# Patient Record
Sex: Male | Born: 1975 | Race: White | Hispanic: Yes | Marital: Married | Smoking: Never smoker
Health system: Southern US, Community
[De-identification: ages and names within clinical notes are randomized; demographics above are authoritative.]

---

## 2018-06-17 ENCOUNTER — Encounter: Payer: Self-pay | Admitting: Family Medicine

## 2018-06-17 ENCOUNTER — Other Ambulatory Visit: Payer: Self-pay

## 2018-06-17 ENCOUNTER — Ambulatory Visit: Payer: Self-pay | Admitting: Family Medicine

## 2018-06-17 ENCOUNTER — Ambulatory Visit: Payer: Self-pay | Admitting: *Deleted

## 2018-06-17 ENCOUNTER — Ambulatory Visit: Payer: 59 | Admitting: Family Medicine

## 2018-06-17 NOTE — Telephone Encounter (Signed)
Patient is new to [place] and has a new patient appointment scheduled with Dr. Terence Lux on 07/18/18 Wife is calling for husband who is at work he has had dizziness without vertigo and a headache for several days. Denies fever/CP/SOB/ NV/cough/congestion. Feels like he is hearing thru a tunnel but no ear pain. He is new to [place] and has a new patient appointment with Dr. Terence Lux on 07/18/18. Speaks Spanish with  some [place]. Acute scheduled for this afternoon. Denies taking any routine medications.  Reason for Disposition . [1] MODERATE dizziness (e.g., interferes with normal activities) AND [2] has NOT been evaluated by physician for this  (Exception: dizziness caused by heat exposure, sudden standing, or poor fluid intake)  Answer Assessment - Initial Assessment Questions 1. DESCRIPTION: "Describe your dizziness."     When moving about. 2. LIGHTHEADED: "Do you feel lightheaded?" (e.g., somewhat faint, woozy, weak upon standing)     Woozy at time. 3. VERTIGO: "Do you feel like either you or the room is spinning or tilting?" (i.e. vertigo)     4. SEVERITY: "How bad is it?"  "Do you feel like you are going to faint?" "Can you stand and walk?"   - MILD - walking normally   - MODERATE - interferes with normal activities (e.g., work, school)    - SEVERE - unable to stand, requires support to walk, feels like passing out now.      Mild he is at work 5. ONSET:  "When did the dizziness begin?"    Several days ago 6. AGGRAVATING FACTORS: "Does anything make it worse?" (e.g., standing, change in head position)     Moving around 7. HEART RATE: "Can you tell me your heart rate?" "How many beats in 15 seconds?"  (Note: not all patients can do this)       Not speaking with patient directly 8. CAUSE: "What do you think is causing the dizziness?"     unsure 9. RECURRENT SYMPTOM: "Have you had dizziness before?" If so, ask: "When was the last time?" "What happened that time?"     no 10. OTHER SYMPTOMS:  "Do you have any other symptoms?" (e.g., fever, chest pain, vomiting, diarrhea, bleeding)       Denies all listed but a headache 11. PREGNANCY: "Is there any chance you are pregnant?" "When was your last menstrual period?"       na  Protocols used: DIZZINESS [facility]

## 2018-06-17 NOTE — Patient Instructions (Signed)
° ° ° °  If you have lab work done today you will be contacted with your lab results within the next 2 weeks.  If you have not heard from us then please contact us. The fastest way to get your results is to register for My Chart. ° ° °IF you received an x-ray today, you will receive an invoice from [facility]. Please contact [facility] at [phone] with questions or concerns regarding your invoice.  ° °IF you received labwork today, you will receive an invoice from [facility]. Please contact [facility] at [phone] with questions or concerns regarding your invoice.  ° °Our billing staff will not be able to assist you with questions regarding bills from these companies. ° °You will be contacted with the lab results as soon as they are available. The fastest way to get your results is to activate your My Chart account. Instructions are located on the last page of this paperwork. If you have not heard from us regarding the results in 2 weeks, please contact this office. °  ° ° ° °

## 2018-06-19 ENCOUNTER — Encounter: Payer: Self-pay | Admitting: Emergency Medicine

## 2018-06-19 ENCOUNTER — Other Ambulatory Visit: Payer: Self-pay

## 2018-06-19 ENCOUNTER — Ambulatory Visit (INDEPENDENT_AMBULATORY_CARE_PROVIDER_SITE_OTHER): Payer: 59 | Admitting: Emergency Medicine

## 2018-06-19 VITALS — BP 124/71 | HR 65 | Temp 99.2°F | Resp 16 | Ht 65.5 in | Wt 179.0 lb

## 2018-06-19 DIAGNOSIS — R42 Dizziness and giddiness: Secondary | ICD-10-CM | POA: Insufficient documentation

## 2018-06-19 DIAGNOSIS — I10 Essential (primary) hypertension: Secondary | ICD-10-CM

## 2018-06-19 MED ORDER — LOSARTAN POTASSIUM 25 MG PO TABS: 25.0000 mg | ORAL_TABLET | Freq: Every day | ORAL | 3 refills | Status: DC

## 2018-06-19 NOTE — Patient Instructions (Addendum)
If you have lab work done today you will be contacted with your lab results within the next 2 weeks.  If you have not heard from [place] then please contact us. The fastest way to get your results is to register for My Chart.   IF you received an x-ray today, you will receive an invoice from [facility]. Please contact [facility] at [phone] with questions or concerns regarding your invoice.   IF you received labwork today, you will receive an invoice from [facility]. Please contact [facility] at [phone] with questions or concerns regarding your invoice.   Our billing staff will not be able to assist you with questions regarding bills from these companies.  You will be contacted with the lab results as soon as they are available. The fastest way to get your results is to activate your My Chart account. Instructions are located on the last page of this paperwork. If you have not heard from [place] regarding the results in 2 weeks, please contact this office.     Mantenimiento de Scientist, forensic hombres (Health Maintenance, Male) Un estilo de vida saludable y los cuidados preventivos son importantes para la salud y Musician. Pregntele al mdico cul es el cronograma de exmenes peridicos adecuado para usted. [place]? Consuma una dieta saludable  Coma muchas verduras, frutas, cereales integrales, productos lcteos con bajo contenido de grasa y Advertising account planner.  No consuma muchos alimentos de alto contenido de grasas slidas, azcares agregados o sal. Mantenga un peso saludable La actividad fsica habitual puede ayudarlo a Science writer o mantener un peso saludable. Deber hacer lo siguiente:  Realizar al menos 126minutos de actividad fsica por semana. El ejercicio debe aumentar la frecuencia cardaca y Actor la transpiracin (ejercicio de [place]).  Hacer ejercicios de entrenamiento de fuerza por lo [employer] por  semana. Controlarse los niveles de colesterol y lpidos en la sangre  Hgase anlisis de sangre para controlar los lpidos y el colesterol cada 5aos a partir de los 35aos. Si tiene un riesgo alto de Best boy cardiopatas coronarias, debe comenzar a [employer] de [place] a los [place]. Es posible que Automotive engineer los niveles de colesterol con mayor frecuencia si: ? Sus niveles de lpidos y colesterol son altos. ? Es mayor de 51OAC. ? Tiene un riesgo alto de tener cardiopatas coronarias. QU DEBO SABER SOBRE LAS PRUEBAS DE [place]? Muchos tipos de cncer se pueden detectar de manera temprana y a menudo prevenirse. Cncer de pulmn  Debe someterse a pruebas de deteccin de cncer de pulmn todos los aos en los siguientes casos: ? Si fuma actualmente y lo ha hecho durante por lo menos 30aos. ? Si fue fumador que dej el hbito en el trmino de los ltimos 15aos.  Hable con el mdico sobre las opciones en relacin con los estudios de deteccin, cundo debe comenzar a Insurance underwriter y con Armed forces operational officer. Cncer colorrectal  Generalmente, las pruebas de deteccin habituales del cncer colorrectal comienzan a los 50aos y deben repetirse cada 5 a 10aos hasta los 75aos. Es posible que tenga que hacerse las pruebas con mayor frecuencia si se detectan formas tempranas de plipos precancerosos o pequeos bultos. Sin embargo, el mdico podr aconsejarle que lo haga antes, si tiene factores de riesgo para el cncer de colon.  El mdico puede recomendarle que use kits de prueba caseros para hallar sangre oculta en la materia fecal.  Se puede usar  una pequea cmara en el extremo de un tubo para examinar el colon (sigmoidoscopia o colonoscopia). Este estudio [employer] formas ms tempranas de Surveyor, minerals. Cncer de prstata y de testculo  En funcin de la edad y del [place] de salud general, el mdico puede realizarle determinados estudios de deteccin del cncer  de prstata y de testculo.  Hable con el mdico sobre cualquier sntoma o acerca de las inquietudes que tenga sobre el cncer de prstata o de testculo. Cncer de piel  Revise la piel de la cabeza a los pies con regularidad.  Informe al mdico si aparecen nuevos lunares o si nota cambios en los que ya tiene, especialmente en estos casos: ? Si hay un cambio en el tamao, la forma o el color del lunar. ? Si tiene un lunar que es ms grande que el tamao de una goma de Games developer.  Siempre use pantalla solar. Aplquese pantalla solar de Lanell Matar y repetida a lo largo del Training and development officer.  Use mangas y [employer], un sombrero de ala ancha y gafas para el sol cuando est al Alexis Goodell, para protegerse. QU DEBO SABER SOBRE LAS CARDIOPATAS CORONARIAS, LA DIABETES Y LA HIPERTENSIN ARTERIAL?  Si usted tiene entre 18 y 30aos, debe medirse la presin arterial cada 3a 5aos. Si usted tiene 40aos o ms, debe medirse la presin arterial [employer]. Debe medirse la presin arterial dos veces: una vez cuando est en un hospital o una clnica y la otra vez cuando est en otro sitio. Registre el promedio de [employer]. Para controlar su presin arterial cuando no est en un hospital o [place], puede usar lo siguiente: ? Jorje Guild automtica para medir la presin arterial en una farmacia. ? Un monitor para medir la presin arterial en el hogar.  Hable con el mdico [employer] ideales de la presin arterial.  Si tiene entre 64 y 79aos, consltele al mdico si debe tomar aspirina para evitar las cardiopatas coronarias.  Hgase anlisis habituales de deteccin de la diabetes; para ello, contrlese la glucemia en ayunas. ? Si su peso es normal y tiene un bajo riesgo de padecer diabetes, realcese este anlisis cada tres aos despus de los 45aos. ? Si tiene sobrepeso y un alto riesgo de padecer diabetes, considere someterse a este anlisis antes o con mayor  frecuencia.  Para los hombres que tienen entre 98 y 43aos, y son o han sido fumadores, se recomienda un nico estudio con ecografa para Hydrographic surveyor un aneurisma de aorta abdominal (AAA). QU DEBO SABER SOBRE LA PREVENCIN DE LAS INFECCIONES? HepatitisB Si tiene un riesgo ms alto de Museum/gallery curator hepatitis B, debe someterse a un examen de deteccin de este virus. Hable con el mdico para determinar si corre riesgo de tener una infeccin por hepatitisB. Hepatitis C Se recomienda un anlisis de [place] para:  Todos los que nacieron entre 1945 y [phone].  Todas las personas que tengan un riesgo de haber contrado hepatitis C. Enfermedades de transmisin sexual (ETS)  Debe realizarse pruebas de Programme researcher, broadcasting/film/video de las ETS todos los aos, incluidas la gonorrea y la clamidia, en estos casos: ? Es sexualmente activo y es menor de [place]. ? Es mayor de 24aos, y Investment banker, operational informa que corre riesgo de tener este tipo de infecciones. ? La actividad sexual ha cambiado desde que le hicieron la ltima prueba de deteccin y tiene un riesgo mayor de Best boy clamidia o Radio broadcast assistant. Pregntele al mdico si usted tiene riesgo.  Consulte a su  mdico para saber si tiene un alto riesgo de infectarse por el VIH. El mdico puede recomendarle un medicamento de venta con receta para ayudar a evitar la infeccin por el VIH. QU MS PUEDO HACER?  Realcese los estudios de rutina de la salud, dentales y de Wellsite geologist.  Mantngase al da con las vacunas (inmunizaciones).  No consuma ningn producto que contenga tabaco, lo que incluye cigarrillos, tabaco de Theatre manager y Administrator, Civil Service. Si necesita ayuda para dejar de fumar, consulte al mdico.  Limite el consumo de alcohol a no ms de por da. [employer] a 12 onzas de cerveza, 5onzas de vino o 1onzas de bebidas alcohlicas de alta graduacin.  No consuma drogas.  No comparta agujas.  Solicite ayuda a su mdico si necesita apoyo o informacin para  abandonar las drogas.  Informe a su mdico si a menudo se siente deprimido.  Notifique a su mdico si alguna vez ha sido vctima de abuso o si no se siente seguro en su hogar. Esta informacin no tiene Theme park manager el consejo del mdico. Asegrese de hacerle al mdico cualquier pregunta que tenga. Document Released: 11/25/2007 Document Revised: 06/19/2014 Document Reviewed: 03/02/2015 Elsevier Interactive Patient Education  2019 [employer].

## 2018-06-19 NOTE — Progress Notes (Signed)
Kyle Hancock 43 y.o.   Chief Complaint  Patient presents with  . Headache    x 5 days  . Dizziness    HISTORY OF PRESENT ILLNESS: This is a 43 y.o. male history of renal vascular hypertension, has taken losartan in the past but presently not taking it, felt dizzy last Sunday morning, however he was able to go and play soccer for 2 hours in the afternoon without any problems or symptoms.  Monday woke up with a headache but was able to go to work.  Headache improved without medication and no other symptoms.  On Tuesday was able to go to the gym and do cardiovascular workout without any symptoms or problems.  Feels better today asymptomatic  HPI   Prior to Admission medications   Medication Sig Start Date End Date Taking? Authorizing Provider  Cyanocobalamin (VITAMIN B 12 PO) Take by mouth daily.   Yes [provider]    No Known Allergies  There are no active problems to display for this patient.   No past medical history on file.    Social History   Socioeconomic History  . Marital status: Married    Spouse name: Not on file  . Number of children: Not on file  . Years of education: Not on file  . Highest education level: Not on file  Occupational History  . Not on file  Social Needs  . Financial resource strain: Not on file  . Food insecurity:    Worry: Not on file    Inability: Not on file  . Transportation needs:    Medical: Not on file    Non-medical: Not on file  Tobacco Use  . Smoking status: Never Smoker  . Smokeless tobacco: Never Used  Substance and Sexual Activity  . Alcohol use: Not on file  . Drug use: Not on file  . Sexual activity: Not on file  Lifestyle  . Physical activity:    Days per week: Not on file    Minutes per session: Not on file  . Stress: Not on file  Relationships  . Social connections:    Talks on phone: Not on file    Gets together: Not on file    Attends religious service: Not on file    Active member  of club or organization: Not on file    Attends meetings of clubs or organizations: Not on file    Relationship status: Not on file  . Intimate partner violence:    Fear of current or ex partner: Not on file    Emotionally abused: Not on file    Physically abused: Not on file    Forced sexual activity: Not on file  Other Topics Concern  . Not on file  Social History Narrative  . Not on file    No family history on file.   Review of Systems  Constitutional: Negative.  Negative for chills and fever.  HENT: Negative.   Eyes: Negative.  Negative for blurred vision and double vision.  Respiratory: Negative.  Negative for cough and shortness of breath.   Cardiovascular: Negative.  Negative for chest pain and palpitations.  Gastrointestinal: Negative.  Negative for abdominal pain, diarrhea, nausea and vomiting.  Genitourinary: Negative.  Negative for dysuria and hematuria.  Skin: Negative.  Negative for rash.  Neurological: Positive for dizziness and headaches.  All other systems reviewed and are negative.   Vitals:   06/19/18 1627  BP: 124/71  Pulse: 65  Resp:  16  Temp: 99.2 F (37.3 C)  SpO2: 97%    Physical Exam Vitals signs reviewed.  Constitutional:      Appearance: He is well-developed.  HENT:     Head: Normocephalic and atraumatic.  Eyes:     Extraocular Movements: Extraocular movements intact.     Pupils: Pupils are equal, round, and reactive to light.  Neck:     Musculoskeletal: Normal range of motion and neck supple.  Cardiovascular:     Rate and Rhythm: Normal rate and regular rhythm.  Pulmonary:     Effort: Pulmonary effort is normal.     Breath sounds: Normal breath sounds.  Abdominal:     General: There is no distension.     Palpations: Abdomen is soft.  Musculoskeletal: Normal range of motion.  Skin:    General: Skin is warm and dry.  Neurological:     General: No focal deficit present.     Mental Status: He is alert and oriented to person,  place, and time.     Cranial Nerves: No cranial nerve deficit.     Sensory: No sensory deficit.     Motor: No weakness.     Coordination: Coordination normal.     Gait: Gait normal.  Psychiatric:        Mood and Affect: Mood normal.        Behavior: Behavior normal.    A total of 30 minutes was spent in the room with the patient, greater than 50% of which was in counseling/coordination of care regarding differential pulses, treatment, medications, and need for blood work and follow-up.   ASSESSMENT & PLAN: Kyle Hancock was seen today for headache and dizziness.  Diagnoses and all orders for this visit:  Dizziness -     CBC with Differential/Platelet -     Comprehensive metabolic panel -     Hemoglobin A1c -     TSH -     Lipid panel  Essential hypertension -     losartan (COZAAR) 25 MG tablet; Take 1 tablet (25 mg total) by mouth daily.    Patient Instructions       If you have lab work done today you will be contacted with your lab results within the next 2 weeks.  If you have not heard from us then please contact us. The fastest way to get your results is to register for My Chart.   IF you received an x-ray today, you will receive an invoice [facility]. Please contact [facility] at [phone] with questions or concerns regarding your invoice.   IF you received labwork today, you will receive an invoice from [facility]. Please contact [facility] at [phone] with questions or concerns regarding your invoice.   Our billing staff will not be able to assist you with questions regarding bills from these companies.  You will be contacted with the lab results as soon as they are available. The fastest way to get your results is to activate your My Chart account. Instructions are located on the last page of this paperwork. If you have not heard from us regarding the results in 2 weeks, please contact this office.     Mantenimiento de Engineer, civil (consulting)la salud en los  hombres (Health Maintenance, Male) Un estilo de vida saludable y los cuidados preventivos son importantes para la salud y Counsellorel bienestar. Pregntele al mdico cul es el cronograma de exmenes peridicos adecuado para usted. QU DEBO SABER SOBRE EL PESO Y LA DIETA? Consuma una dieta  saludable  Coma muchas verduras, frutas, cereales integrales, productos lcteos con bajo contenido de grasa y Associate Professorprotenas magras.  No consuma muchos alimentos de alto contenido de grasas slidas, azcares agregados o sal. Mantenga un peso saludable La actividad fsica habitual puede ayudarlo a Baristaalcanzar o mantener un peso saludable. Deber hacer lo siguiente:  Realizar al menos 150minutos de actividad fsica por semana. El ejercicio debe aumentar la frecuencia cardaca y Development worker, international aidprovocar la transpiracin (ejercicio de Grass Valleyintensidad moderada).  Hacer ejercicios de entrenamiento de fuerza por lo Rite Aidmenos dos veces por semana. Controlarse los niveles de colesterol y lpidos en la sangre  Hgase anlisis de sangre para controlar los lpidos y el colesterol cada 5aos a partir de los 35aos. Si tiene un riesgo alto de Warehouse managertener cardiopatas coronarias, debe comenzar a Assuranthacerse anlisis de [place] Colonysangre a los [place]20aos. Es posible que Insurance underwriternecesite controlar los niveles de colesterol con mayor frecuencia si: ? Sus niveles de lpidos y colesterol son altos. ? Es mayor de [id number]. ? Tiene un riesgo alto de tener cardiopatas coronarias. QU DEBO SABER SOBRE LAS PRUEBAS DE DETECCIN DEL CNCER? Muchos tipos de cncer se pueden detectar de manera temprana y a menudo prevenirse. Cncer de pulmn  Debe someterse a pruebas de deteccin de cncer de pulmn todos los aos en los siguientes casos: ? Si fuma actualmente y lo ha hecho durante por lo menos 30aos. ? Si fue fumador que dej el hbito en el trmino de los ltimos 15aos.  Hable con el mdico sobre las opciones en relacin con los estudios de deteccin, cundo debe comenzar a Actuaryhacrselos y con  Engineer, structuralqu frecuencia. Cncer colorrectal  Generalmente, las pruebas de deteccin habituales del cncer colorrectal comienzan a los 50aos y deben repetirse cada 5 a 10aos hasta los 75aos. Es posible que tenga que hacerse las pruebas con mayor frecuencia si se detectan formas tempranas de plipos precancerosos o pequeos bultos. Sin embargo, el mdico podr aconsejarle que lo haga antes, si tiene factores de riesgo para el cncer de colon.  El mdico puede recomendarle que use kits de prueba caseros para Recruitment consultanthallar sangre oculta en la materia fecal.  Se puede usar una pequea cmara en el extremo de un tubo para examinar el colon (sigmoidoscopia o colonoscopia). Este estudio PPG Industriesdetecta las formas ms tempranas de Building services engineercncer colorrectal. Cncer de prstata y de testculo  En funcin de la edad y del Crystal Beachestado de salud general, el mdico puede realizarle determinados estudios de deteccin del cncer de prstata y de testculo.  Hable con el mdico sobre cualquier sntoma o acerca de las inquietudes que tenga sobre el cncer de prstata o de testculo. Cncer de piel  Revise la piel de la cabeza a los pies con regularidad.  Informe al mdico si aparecen nuevos lunares o si nota cambios en los que ya tiene, especialmente en estos casos: ? Si hay un cambio en el tamao, la forma o el color del lunar. ? Si tiene un lunar que es ms grande que el tamao de una goma de Paramediclpiz.  Siempre use pantalla solar. Aplquese pantalla solar de Barth Kirksmanera generosa y repetida a lo largo del Futures traderda.  Use mangas y Automatic Datapantalones largos, un sombrero de ala ancha y gafas para el sol cuando est al Guadalupe Dawnaire libre, para protegerse. QU DEBO SABER SOBRE LAS CARDIOPATAS CORONARIAS, LA DIABETES Y LA HIPERTENSIN ARTERIAL?  Si usted tiene entre 18 y 39aos, debe medirse la presin arterial cada 3a 5aos. Si usted tiene 40aos o ms, debe medirse la presin arterial Allied Waste Industriestodos los aos.  Debe medirse la presin arterial dos veces: una vez cuando est en  un hospital o una clnica y la otra vez cuando est en otro sitio. Registre el promedio de [employer]. Para controlar su presin arterial cuando no est en un hospital o Paulita Cradle, puede usar lo siguiente: ? Valere Dross automtica para medir la presin arterial en una farmacia. ? Un monitor para medir la presin arterial en el hogar.  Hable con el mdico [employer] ideales de la presin arterial.  Si tiene entre 45 y 79aos, consltele al mdico si debe tomar aspirina para evitar las cardiopatas coronarias.  Hgase anlisis habituales de deteccin de la diabetes; para ello, contrlese la glucemia en ayunas. ? Si su peso es normal y tiene un bajo riesgo de padecer diabetes, realcese este anlisis cada tres aos despus de los 45aos. ? Si tiene sobrepeso y un alto riesgo de padecer diabetes, considere someterse a este anlisis antes o con mayor frecuencia.  Para los hombres que tienen entre 65 y 73aos, y son o han sido fumadores, se recomienda un nico estudio con ecografa para Engineer, manufacturing un aneurisma de aorta abdominal (AAA). QU DEBO SABER SOBRE LA PREVENCIN DE LAS INFECCIONES? HepatitisB Si tiene un riesgo ms alto de Primary school teacher hepatitis B, debe someterse a un examen de deteccin de este virus. Hable con el mdico para determinar si corre riesgo de tener una infeccin por hepatitisB. Hepatitis C Se recomienda un anlisis de [place] para:  Todos los que nacieron entre 1945 y [phone].  Todas las personas que tengan un riesgo de haber contrado hepatitis C. Enfermedades de transmisin sexual (ETS)  Debe realizarse pruebas de Airline pilot de las ETS todos los aos, incluidas la gonorrea y la clamidia, en estos casos: ? Es sexualmente activo y es menor de [place]. ? Es mayor de 24aos, y Public affairs consultant informa que corre riesgo de tener este tipo de infecciones. ? La actividad sexual ha cambiado desde que le hicieron la ltima prueba de deteccin y tiene un riesgo mayor de  Warehouse manager clamidia o Copy. Pregntele al mdico si usted tiene riesgo.  Consulte a su mdico para saber si tiene un alto riesgo de infectarse por el VIH. El mdico puede recomendarle un medicamento de venta con receta para ayudar a evitar la infeccin por el VIH. QU MS PUEDO HACER?  Realcese los estudios de rutina de la salud, dentales y de Wellsite geologist.  Mantngase al da con las vacunas (inmunizaciones).  No consuma ningn producto que contenga tabaco, lo que incluye cigarrillos, tabaco de Theatre manager y Administrator, Civil Service. Si necesita ayuda para dejar de fumar, consulte al mdico.  Limite el consumo de alcohol a no ms de por da. [employer] a 12 onzas de cerveza, 5onzas de vino o 1onzas de bebidas alcohlicas de alta graduacin.  No consuma drogas.  No comparta agujas.  Solicite ayuda a su mdico si necesita apoyo o informacin para abandonar las drogas.  Informe a su mdico si a menudo se siente deprimido.  Notifique a su mdico si alguna vez ha sido vctima de abuso o si no se siente seguro en su hogar. Esta informacin no tiene Theme park manager el consejo del mdico. Asegrese de hacerle al mdico cualquier pregunta que tenga. Document Released: 11/25/2007 Document Revised: 06/19/2014 Document Reviewed: 03/02/2015 Elsevier Interactive Patient Education  2019 Elsevier Inc.      Edwina Barth, MD [facility]

## 2018-06-20 LAB — CBC WITH DIFFERENTIAL/PLATELET
Basophils Absolute: 0.1 10*3/uL (ref 0.0–0.2)
Basos: 1 %
EOS (ABSOLUTE): 0.1 10*3/uL (ref 0.0–0.4)
Eos: 1 %
Hematocrit: 44.8 % (ref 37.5–51.0)
Hemoglobin: 14.7 g/dL (ref 13.0–17.7)
Immature Grans (Abs): 0 10*3/uL (ref 0.0–0.1)
Immature Granulocytes: 0 %
LYMPHS: 33 %
Lymphocytes Absolute: 2 10*3/uL (ref 0.7–3.1)
MCH: 26.1 pg — ABNORMAL LOW (ref 26.6–33.0)
MCHC: 32.8 g/dL (ref 31.5–35.7)
MCV: 80 fL (ref 79–97)
Monocytes Absolute: 0.4 10*3/uL (ref 0.1–0.9)
Monocytes: 7 %
Neutrophils Absolute: 3.5 10*3/uL (ref 1.4–7.0)
Neutrophils: 58 %
Platelets: 271 10*3/uL (ref 150–450)
RBC: 5.63 x10E6/uL (ref 4.14–5.80)
RDW: 13 % (ref 11.6–15.4)
WBC: 6.1 10*3/uL (ref 3.4–10.8)

## 2018-06-20 LAB — LIPID PANEL
Chol/HDL Ratio: 3.1 ratio (ref 0.0–5.0)
Cholesterol, Total: 217 mg/dL — ABNORMAL HIGH (ref 100–199)
HDL: 69 mg/dL (ref >39)
LDL Calculated: 113 mg/dL — ABNORMAL HIGH (ref 0–99)
Triglycerides: 173 mg/dL — ABNORMAL HIGH (ref 0–149)
VLDL Cholesterol Cal: 35 mg/dL (ref 5–40)

## 2018-06-20 LAB — COMPREHENSIVE METABOLIC PANEL
A/G RATIO: 2 (ref 1.2–2.2)
ALT: 43 IU/L (ref 0–44)
AST: 35 IU/L (ref 0–40)
Albumin: 4.8 g/dL (ref 3.5–5.5)
Alkaline Phosphatase: 81 IU/L (ref 39–117)
BILIRUBIN TOTAL: 0.3 mg/dL (ref 0.0–1.2)
BUN/Creatinine Ratio: 16 (ref 9–20)
BUN: 18 mg/dL (ref 6–24)
CALCIUM: 10.4 mg/dL — AB (ref 8.7–10.2)
CO2: 23 mmol/L (ref 20–29)
Chloride: 103 mmol/L (ref 96–106)
Creatinine, Ser: 1.14 mg/dL (ref 0.76–1.27)
GFR calc Af Amer: 92 mL/min/{1.73_m2} (ref >59)
GFR calc non Af Amer: 79 mL/min/{1.73_m2} (ref >59)
Globulin, Total: 2.4 g/dL (ref 1.5–4.5)
Glucose: 107 mg/dL — ABNORMAL HIGH (ref 65–99)
POTASSIUM: 4.3 mmol/L (ref 3.5–5.2)
Sodium: 142 mmol/L (ref 134–144)
Total Protein: 7.2 g/dL (ref 6.0–8.5)

## 2018-06-20 LAB — HEMOGLOBIN A1C
Est. average glucose Bld gHb Est-mCnc: 114 mg/dL
Hgb A1c MFr Bld: 5.6 % (ref 4.8–5.6)

## 2018-06-20 LAB — TSH: TSH: 1.31 u[IU]/mL (ref 0.450–4.500)

## 2018-06-21 ENCOUNTER — Encounter: Payer: Self-pay | Admitting: Radiology

## 2018-06-24 ENCOUNTER — Telehealth: Payer: Self-pay | Admitting: Emergency Medicine

## 2018-06-24 ENCOUNTER — Encounter: Payer: Self-pay | Admitting: Emergency Medicine

## 2018-06-24 NOTE — Telephone Encounter (Signed)
Result note read to patient; verbalizes understanding. Pt requests results be routed to MyChart. Also states he would like Dr. Alvy Bimler to know he is taking medication as instructed and taking his BP daily. He will bring record to next OV.   Results from 06/19/2018, unable to document in result notes.

## 2018-07-18 ENCOUNTER — Encounter: Payer: Self-pay | Admitting: Emergency Medicine

## 2018-07-18 ENCOUNTER — Telehealth: Payer: Self-pay | Admitting: Emergency Medicine

## 2018-07-18 ENCOUNTER — Other Ambulatory Visit: Payer: Self-pay

## 2018-07-18 ENCOUNTER — Ambulatory Visit (INDEPENDENT_AMBULATORY_CARE_PROVIDER_SITE_OTHER): Payer: 59 | Admitting: Emergency Medicine

## 2018-07-18 VITALS — BP 118/67 | HR 70 | Temp 98.9°F | Resp 16 | Ht 65.5 in | Wt 175.6 lb

## 2018-07-18 DIAGNOSIS — E785 Hyperlipidemia, unspecified: Secondary | ICD-10-CM

## 2018-07-18 DIAGNOSIS — R42 Dizziness and giddiness: Secondary | ICD-10-CM | POA: Diagnosis not present

## 2018-07-18 DIAGNOSIS — I10 Essential (primary) hypertension: Secondary | ICD-10-CM

## 2018-07-18 NOTE — Telephone Encounter (Signed)
[facility] called and spoke to [place], [facility] about the refill request. She says there are refills available. The 90 day supply sent on 06/19/18, the patient picked up a 30 day supply, so he still has the 60 left and he may have to pay out of pocket, if the insurance doesn't approve the 60. I called the patient and advised to call the pharmacy for the refill and advised of the above, he verbalized understanding.

## 2018-07-18 NOTE — Progress Notes (Signed)
Kyle Hancock 43 y.o.   Chief Complaint  Patient presents with  . Dizziness    per patient sometimes he still have and this morning, follow up weight and  blood pressure    HISTORY OF PRESENT ILLNESS: This is a 43 y.o. male here for follow-up of 06/19/2018 visit when I saw him for intermittent dizziness.  Work-up was negative.  Working out for a Marathon coming up this weekend.  Occasionally has some dizziness but is self-limited and does not last long.  No associated symptoms.  Has been taking losartan for blood pressure.  Blood pressure chart at home within normal limits.  No other complaints or medical concerns.  Recent blood work showed normal CBC and normal CMP.  Nonfasting sample showed increased cholesterol and triglycerides.  HPI   Prior to Admission medications   Medication Sig Start Date End Date Taking? Authorizing Provider  losartan (COZAAR) 25 MG tablet Take 1 tablet (25 mg total) by mouth daily. 06/19/18 09/17/18 Yes Miho Monda, Eilleen KempfMiguel Jose, MD  Cyanocobalamin (VITAMIN B 12 PO) Take by mouth daily.    [provider]    No Known Allergies  Patient Active Problem List   Diagnosis Date Noted  . Dizziness 06/19/2018  . Essential hypertension 06/19/2018    No past medical history on file.  No past surgical history on file.  Social History   Socioeconomic History  . Marital status: Married    Spouse name: Not on file  . Number of children: Not on file  . Years of education: Not on file  . Highest education level: Not on file  Occupational History  . Not on file  Social Needs  . Financial resource strain: Not on file  . Food insecurity:    Worry: Not on file    Inability: Not on file  . Transportation needs:    Medical: Not on file    Non-medical: Not on file  Tobacco Use  . Smoking status: Never Smoker  . Smokeless tobacco: Never Used  Substance and Sexual Activity  . Alcohol use: Not on file  . Drug use: Not on file  . Sexual activity:  Not on file  Lifestyle  . Physical activity:    Days per week: Not on file    Minutes per session: Not on file  . Stress: Not on file  Relationships  . Social connections:    Talks on phone: Not on file    Gets together: Not on file    Attends religious service: Not on file    Active member of club or organization: Not on file    Attends meetings of clubs or organizations: Not on file    Relationship status: Not on file  . Intimate partner violence:    Fear of current or ex partner: Not on file    Emotionally abused: Not on file    Physically abused: Not on file    Forced sexual activity: Not on file  Other Topics Concern  . Not on file  Social History Narrative  . Not on file    No family history on file.   Review of Systems  Constitutional: Negative.   HENT: Negative.   Eyes: Negative.   Cardiovascular: Negative.   Gastrointestinal: Negative.   Genitourinary: Negative.   Skin: Negative.   Neurological: Positive for dizziness. Negative for headaches.  Endo/Heme/Allergies: Negative.   All other systems reviewed and are negative.  Vitals:   07/18/18 0812  BP: 118/67  Pulse: 70  Resp: 16  Temp: 98.9 F (37.2 C)  SpO2: 96%     Physical Exam Vitals signs reviewed.  Constitutional:      Appearance: Normal appearance.  HENT:     Head: Normocephalic and atraumatic.     Nose: Nose normal.     Mouth/Throat:     Mouth: Mucous membranes are moist.     Pharynx: Oropharynx is clear.  Eyes:     Extraocular Movements: Extraocular movements intact.     Conjunctiva/sclera: Conjunctivae normal.     Pupils: Pupils are equal, round, and reactive to light.  Cardiovascular:     Pulses: Normal pulses.     Heart sounds: Normal heart sounds.  Pulmonary:     Effort: Pulmonary effort is normal.     Breath sounds: Normal breath sounds.  Abdominal:     Palpations: Abdomen is soft.     Tenderness: There is no abdominal tenderness.  Musculoskeletal: Normal range of motion.    Skin:    General: Skin is warm and dry.     Capillary Refill: Capillary refill takes less than 2 seconds.  Neurological:     General: No focal deficit present.     Mental Status: He is alert and oriented to person, place, and time.  Psychiatric:        Mood and Affect: Mood normal.        Behavior: Behavior normal.      ASSESSMENT & PLAN: Kyle Hancock was seen today for dizziness.  Diagnoses and all orders for this visit:  Dyslipidemia -     Lipid panel  Dizziness  Essential hypertension     Patient Instructions       If you have lab work done today you will be contacted with your lab results within the next 2 weeks.  If you have not heard from [place] then please contact us. The fastest way to get your results is to register for My Chart.   IF you received an x-ray today, you will receive an invoice from [facility]. Please contact [facility] at [phone] with questions or concerns regarding your invoice.   IF you received labwork today, you will receive an invoice from [place]. Please contact [facility] at [phone] with questions or concerns regarding your invoice.   Our billing staff will not be able to assist you with questions regarding bills from these companies.  You will be contacted with the lab results as soon as they are available. The fastest way to get your results is to activate your My Chart account. Instructions are located on the last page of this paperwork. If you have not heard from [place] regarding the results in 2 weeks, please contact this office.     Health Maintenance, Male A healthy lifestyle and preventive care is important for your health and wellness. Ask your health care provider about what schedule of regular examinations is right for you. What should I know about weight and diet? Eat a Healthy Diet  Eat plenty of vegetables, fruits, whole grains, low-fat dairy products, and lean protein.  Do not eat a lot of foods high  in solid fats, added sugars, or salt.  Maintain a Healthy Weight Regular exercise can help you achieve or maintain a healthy weight. You should:  Do at least 150 minutes of exercise each week. The exercise should increase your heart rate and make you sweat (moderate-intensity exercise).  Do strength-training exercises at least twice a week. Watch Your Levels of Cholesterol and Blood Lipids  Have your blood tested for lipids and cholesterol every 5 years starting at 42 years of age. If you are at high risk for heart disease, you should start having your blood tested when you are 42 years old. You may need to have your cholesterol levels checked more often if: ? Your lipid or cholesterol levels are high. ? You are older than 42 years of age. ? You are at high risk for heart disease. What should I know about cancer screening? Many types of cancers can be detected early and may often be prevented. Lung Cancer  You should be screened every year for lung cancer if: ? You are a current smoker who has smoked for at least 30 years. ? You are a former smoker who has quit within the past 15 years.  Talk to your health care provider about your screening options, when you should start screening, and how often you should be screened. Colorectal Cancer  Routine colorectal cancer screening usually begins at 42 years of age and should be repeated every 5-10 years until you are 42 years old. You may need to be screened more often if early forms of precancerous polyps or small growths are found. Your health care provider may recommend screening at an earlier age if you have risk factors for colon cancer.  Your health care provider may recommend using home test kits to check for hidden blood in the stool.  A small camera at the end of a tube can be used to examine your colon (sigmoidoscopy or colonoscopy). This checks for the earliest forms of colorectal cancer. Prostate and Testicular Cancer  Depending  on your age and overall health, your health care provider may do certain tests to screen for prostate and testicular cancer.  Talk to your health care provider about any symptoms or concerns you have about testicular or prostate cancer. Skin Cancer  Check your skin from head to toe regularly.  Tell your health care provider about any new moles or changes in moles, especially if: ? There is a change in a mole's size, shape, or color. ? You have a mole that is larger than a pencil eraser.  Always use sunscreen. Apply sunscreen liberally and repeat throughout the day.  Protect yourself by wearing long sleeves, pants, a wide-brimmed hat, and sunglasses when outside. What should I know about heart disease, diabetes, and high blood pressure?  If you are 1618-42 years of age, have your blood pressure checked every 3-5 years. If you are 42 years of age or older, have your blood pressure checked every year. You should have your blood pressure measured twice-once when you are at a hospital or clinic, and once when you are not at a hospital or clinic. Record the average of the two measurements. To check your blood pressure when you are not at a hospital or clinic, you can use: ? An automated blood pressure machine at a pharmacy. ? A home blood pressure monitor.  Talk to your health care provider about your target blood pressure.  If you are between 5645-42 years old, ask your health care provider if you should take aspirin to prevent heart disease.  Have regular diabetes screenings by checking your fasting blood sugar level. ? If you are at a normal weight and have a low risk for diabetes, have this test once every three years after the age of 42. ? If you are overweight and have a high risk for diabetes, consider being tested at a younger  age or more often.  A one-time screening for abdominal aortic aneurysm (AAA) by ultrasound is recommended for men aged 65-75 years who are current or former  smokers. What should I know about preventing infection? Hepatitis B If you have a higher risk for hepatitis B, you should be screened for this virus. Talk with your health care provider to find out if you are at risk for hepatitis B infection. Hepatitis C Blood testing is recommended for:  Everyone born from [birth year] through 1964.  Anyone with known risk factors for hepatitis C. Sexually Transmitted Diseases (STDs)  You should be screened each year for STDs including gonorrhea and chlamydia if: ? You are sexually active and are younger than 42 years of age. ? You are older than 41 years of age and your health care provider tells you that you are at risk for this type of infection. ? Your sexual activity has changed since you were last screened and you are at an increased risk for chlamydia or gonorrhea. Ask your health care provider if you are at risk.  Talk with your health care provider about whether you are at high risk of being infected with HIV. Your health care provider may recommend a prescription medicine to help prevent HIV infection. What else can I do?  Schedule regular health, dental, and eye exams.  Stay current with your vaccines (immunizations).  Do not use any tobacco products, such as cigarettes, chewing tobacco, and e-cigarettes. If you need help quitting, ask your health care provider.  Limit alcohol intake to no more than 2 drinks per day. One drink equals 12 ounces of beer, 5 ounces of wine, or 1 ounces of hard liquor.  Do not use street drugs.  Do not share needles.  Ask your health care provider for help if you need support or information about quitting drugs.  Tell your health care provider if you often feel depressed.  Tell your health care provider if you have ever been abused or do not feel safe at home. This information is not intended to replace advice given to you by your health care provider. Make sure you discuss any questions you have with your  health care provider. Document Released: 11/25/2007 Document Revised: 01/26/2016 Document Reviewed: 03/02/2015 Elsevier Interactive Patient Education  2019 Elsevier Inc.     Edwina Barth, MD [facility]

## 2018-07-18 NOTE — Telephone Encounter (Signed)
Copied from CRM [id number]. Topic: Quick Communication - Rx Refill/Question >> Jul 18, 2018  3:12 PM Tamela Oddi wrote: Medication: losartan (COZAAR) 25 MG tablet  Patient called to request a refill for the above medication.  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

## 2018-07-18 NOTE — Patient Instructions (Addendum)

## 2018-07-19 ENCOUNTER — Encounter: Payer: Self-pay | Admitting: Emergency Medicine

## 2018-07-19 LAB — LIPID PANEL
Chol/HDL Ratio: 2.7 ratio (ref 0.0–5.0)
Cholesterol, Total: 188 mg/dL (ref 100–199)
HDL: 69 mg/dL (ref >39)
LDL CALC: 106 mg/dL — AB (ref 0–99)
Triglycerides: 65 mg/dL (ref 0–149)
VLDL Cholesterol Cal: 13 mg/dL (ref 5–40)

## 2019-02-20 ENCOUNTER — Other Ambulatory Visit: Payer: Self-pay

## 2019-02-20 DIAGNOSIS — Z20822 Contact with and (suspected) exposure to covid-19: Secondary | ICD-10-CM

## 2019-02-21 LAB — NOVEL CORONAVIRUS, NAA: SARS-CoV-2, NAA: DETECTED — AB

## 2019-02-24 ENCOUNTER — Telehealth: Payer: Self-pay | Admitting: Internal Medicine

## 2019-02-24 NOTE — Telephone Encounter (Signed)
Called patient, no answer. Patient is aware of result from [place] and from [facility] This is an MD follow up. Will try again later.  Angelica Chessman, MD

## 2019-05-01 ENCOUNTER — Ambulatory Visit: Payer: Self-pay | Admitting: Emergency Medicine

## 2019-05-02 ENCOUNTER — Encounter: Payer: Self-pay | Admitting: Emergency Medicine

## 2019-08-26 ENCOUNTER — Other Ambulatory Visit: Payer: Self-pay

## 2019-08-26 ENCOUNTER — Ambulatory Visit: Payer: BLUE CROSS/BLUE SHIELD | Admitting: Emergency Medicine

## 2019-08-26 ENCOUNTER — Encounter: Payer: Self-pay | Admitting: Emergency Medicine

## 2019-08-26 VITALS — BP 127/76 | HR 74 | Temp 98.3°F | Resp 16 | Ht 65.75 in | Wt 184.0 lb

## 2019-08-26 DIAGNOSIS — E785 Hyperlipidemia, unspecified: Secondary | ICD-10-CM | POA: Diagnosis not present

## 2019-08-26 DIAGNOSIS — I10 Essential (primary) hypertension: Secondary | ICD-10-CM | POA: Diagnosis not present

## 2019-08-26 DIAGNOSIS — Z8616 Personal history of COVID-19: Secondary | ICD-10-CM

## 2019-08-26 MED ORDER — LOSARTAN POTASSIUM 50 MG PO TABS: 50.0000 mg | ORAL_TABLET | Freq: Every day | ORAL | 3 refills | Status: DC

## 2019-08-26 NOTE — Patient Instructions (Addendum)
   If you have lab work done today you will be contacted with your lab results within the next 2 weeks.  If you have not heard from us then please contact us. The fastest way to get your results is to register for My Chart.   IF you received an x-ray today, you will receive an invoice from [facility]. Please contact [facility] at [phone] with questions or concerns regarding your invoice.   IF you received labwork today, you will receive an invoice from [facility]. Please contact [facility] at [phone] with questions or concerns regarding your invoice.   Our billing staff will not be able to assist you with questions regarding bills from these companies.  You will be contacted with the lab results as soon as they are available. The fastest way to get your results is to activate your My Chart account. Instructions are located on the last page of this paperwork. If you have not heard from us regarding the results in 2 weeks, please contact this office.      Hipertensin en los adultos Hypertension, Adult El trmino hipertensin es otra forma de denominar a la presin arterial elevada. La presin arterial elevada fuerza al corazn a trabajar ms para bombear la sangre. Esto puede causar problemas con el paso del tiempo. Una lectura de presin arterial est compuesta por 2 nmeros. Hay un nmero superior (sistlico) sobre un nmero inferior (diastlico). Lo ideal es tener la presin arterial por debajo de 120/80. Las elecciones saludables pueden ayudar a bajar la presin arterial, o tal vez necesite medicamentos para bajarla. Cules son las causas? Se desconoce la causa de esta afeccin. Algunas afecciones pueden estar relacionadas con la presin arterial alta. Qu incrementa el riesgo?  Fumar.  Tener diabetes mellitus tipo 2, colesterol alto, o ambos.  No hacer la cantidad suficiente de actividad fsica o ejercicio.  Tener sobrepeso.  Consumir mucha grasa,  azcar, caloras o sal (sodio) en su dieta.  Beber alcohol en exceso.  Tener una enfermedad renal a largo plazo (crnica).  Tener antecedentes familiares de presin arterial alta.  Edad. Los riesgos aumentan con la edad.  Raza. El riesgo es mayor para las personas afroamericanas.  Sexo. Antes de los 45aos, los hombres corren ms riesgo que las mujeres. Despus de los 65aos, las mujeres corren ms riesgo que los hombres.  Tener apnea obstructiva del sueo.  Estrs. Cules son los signos o los sntomas?  Es posible que la presin arterial alta puede no cause sntomas. La presin arterial muy alta (crisis hipertensiva) puede provocar: ? Dolor de cabeza. ? Sensaciones de preocupacin o nerviosismo (ansiedad). ? Falta de aire. ? Hemorragia nasal. ? Sensacin de malestar en el estmago (nuseas). ? Vmitos. ? Cambios en la forma de ver. ? Dolor muy intenso en el pecho. ? Convulsiones. Cmo se trata?  Esta afeccin se trata haciendo cambios saludables en el estilo de vida, por ejemplo: ? Consumir alimentos saludables. ? Hacer ms ejercicio. ? Beber menos alcohol.  El mdico puede recetarle medicamentos si los cambios en el estilo de vida no son suficientes para lograr controlar la presin arterial y si: ? El nmero de arriba est por encima de 130. ? El nmero de abajo est por encima de 80.  Su presin arterial personal ideal puede variar. Siga estas instrucciones en su casa: Comida y bebida   Si se lo dicen, siga el plan de alimentacin de DASH (Dietary Approaches to Stop Hypertension, Maneras de alimentarse para detener la hipertensin).   Para seguir este plan: ? Llene la mitad del plato de cada comida con frutas y verduras. ? Llene un cuarto del plato de cada comida con cereales integrales. Los cereales integrales incluyen pasta integral, arroz integral y pan integral. ? Coma y beba productos lcteos con bajo contenido de grasa, como leche descremada o yogur bajo en  grasas. ? Llene un cuarto del plato de cada comida con protenas bajas en grasa (magras). Las protenas bajas en grasa incluyen pescado, pollo sin piel, huevos, frijoles y tofu. ? Evite consumir carne grasa, carne curada y procesada, o pollo con piel. ? Evite consumir alimentos prehechos o procesados.  Consuma menos de 1500 mg de sal por da.  No beba alcohol si: ? El mdico le indica que no lo haga. ? Est embarazada, puede estar embarazada o est tratando de quedar embarazada.  Si bebe alcohol: ? Limite la cantidad que bebe a lo siguiente:  De 0 a 1 medida por da para las mujeres.  De 0 a 2 medidas por da para los hombres. ? Est atento a la cantidad de alcohol que hay en las bebidas que toma. En [place], una medida equivale a una botella de cerveza de 12oz (355ml), un vaso de vino de 5oz (148ml) o un vaso de una bebida alcohlica de alta graduacin de 1oz (44ml). Estilo de vida   Trabaje con su mdico para mantenerse en un peso saludable o para perder peso. Pregntele a su mdico cul es el peso recomendable para usted.  Haga al menos 30minutos de ejercicio la mayora de los das de la semana. Estos pueden incluir caminar, nadar o andar en bicicleta.  Realice al menos 30 minutos de ejercicio que fortalezca sus msculos (ejercicios de resistencia) al menos 3 das a la semana. Estos pueden incluir levantar pesas o hacer Pilates.  No consuma ningn producto que contenga nicotina o tabaco, como cigarrillos, cigarrillos electrnicos y tabaco de mascar. Si necesita ayuda para dejar de fumar, consulte al mdico.  Controle su presin arterial en su casa tal como le indic el mdico.  Concurra a todas las visitas de seguimiento como se lo haya indicado el mdico. Esto es importante. Medicamentos  Tome los medicamentos de venta libre y los recetados solamente como se lo haya indicado el mdico. Siga cuidadosamente las indicaciones.  No omita las dosis de medicamentos  para la presin arterial. Los medicamentos pierden eficacia si omite dosis. El hecho de omitir las dosis tambin aumenta el riesgo de otros problemas.  Pregntele a su mdico a qu efectos secundarios o reacciones a los medicamentos debe prestar atencin. Comunquese con un mdico si:  Piensa que tiene una reaccin a los medicamentos que est tomando.  Tiene dolores de cabeza frecuentes (recurrentes).  Se siente mareado.  Tiene hinchazn en los tobillos.  Tiene problemas de visin. Solicite ayuda inmediatamente si:  Siente un dolor de cabeza muy intenso.  Empieza a sentirse desorientado (confundido).  Se siente dbil o adormecido.  Siente que va a desmayarse.  Tiene un dolor muy intenso en las siguientes zonas: ? Pecho. ? Vientre (abdomen).  Vomita ms de una vez.  Tiene dificultad para respirar. Resumen  El trmino hipertensin es otra forma de denominar a la presin arterial elevada.  La presin arterial elevada fuerza al corazn a trabajar ms para bombear la sangre.  Para la mayora de las personas, una presin arterial normal es menor que 120/80.  Las decisiones saludables pueden ayudarle a disminuir su presin arterial. Si no puede   bajar su presin arterial mediante decisiones saludables, es posible que deba tomar medicamentos. Esta informacin no tiene como fin reemplazar el consejo del mdico. Asegrese de hacerle al mdico cualquier pregunta que tenga. Document Revised: 03/14/2018 Document Reviewed: 03/14/2018 Elsevier Patient Education  2020 Elsevier Inc.  

## 2019-08-26 NOTE — Progress Notes (Signed)
Kyle Hancock 44 y.o.   Chief Complaint  Patient presents with  . Hypertension    with labs    HISTORY OF PRESENT ILLNESS: This is a 44 y.o. male with history of hypertension here for follow-up.  Presently not taking losartan 25 mg.  Ran out of medication.  Needs refill.  Requesting higher dose if needed. BP Readings from Last 3 Encounters:  08/26/19 127/76  07/18/18 118/67  06/19/18 124/71  Also has a history of dyslipidemia on no medication.  Last lipid profile not fasting. Lab Results  Component Value Date   CHOL 188 07/18/2018   HDL 69 07/18/2018   LDLCALC 106 (H) 07/18/2018   TRIG 65 07/18/2018   CHOLHDL 2.7 07/18/2018  Had Covid infection last September with minimal symptoms.  Asymptomatic.  Him and his wife did well. No other complaints or medical concerns today. Past medical history includes left-sided kidney issue several years ago when he developed hydronephrosis and needed surgical intervention.  Unsure of diagnosis.  HPI   Prior to Admission medications   Medication Sig Start Date End Date Taking? Authorizing Provider  Cyanocobalamin (VITAMIN B 12 PO) Take by mouth daily.    [provider]  losartan (COZAAR) 25 MG tablet Take 1 tablet (25 mg total) by mouth daily. 06/19/18 09/17/18  Kyle Quint, MD    No Known Allergies  Patient Active Problem List   Diagnosis Date Noted  . Dyslipidemia 08/26/2019  . Dizziness 06/19/2018  . Essential hypertension 06/19/2018    History reviewed. No pertinent past medical history.  History reviewed. No pertinent surgical history.  Social History   Socioeconomic History  . Marital status: Married    Spouse name: Not on file  . Number of children: Not on file  . Years of education: Not on file  . Highest education level: Not on file  Occupational History  . Not on file  Tobacco Use  . Smoking status: Never Smoker  . Smokeless tobacco: Never Used  Substance and Sexual Activity  .  Alcohol use: Not on file  . Drug use: Not on file  . Sexual activity: Not on file  Other Topics Concern  . Not on file  Social History Narrative  . Not on file   Social Determinants of Health   Financial Resource Strain:   . Difficulty of Paying Living Expenses:   Food Insecurity:   . Worried About Programme researcher, broadcasting/film/video in the Last Year:   . Barista in the Last Year:   Transportation Needs:   . Freight forwarder (Medical):   Marland Kitchen Lack of Transportation (Non-Medical):   Physical Activity:   . Days of Exercise per Week:   . Minutes of Exercise per Session:   Stress:   . Feeling of Stress :   Social Connections:   . Frequency of Communication with Friends and Family:   . Frequency of Social Gatherings with Friends and Family:   . Attends Religious Services:   . Active Member of Clubs or Organizations:   . Attends Banker Meetings:   Marland Kitchen Marital Status:   Intimate Partner Violence:   . Fear of Current or Ex-Partner:   . Emotionally Abused:   Marland Kitchen Physically Abused:   . Sexually Abused:     History reviewed. No pertinent family history.   Review of Systems  Constitutional: Negative.  Negative for chills and fever.  HENT: Negative.  Negative for congestion and sore throat.   Respiratory:  Negative.  Negative for cough.   Cardiovascular: Negative.  Negative for chest pain and palpitations.  Gastrointestinal: Negative.  Negative for abdominal pain, diarrhea, nausea and vomiting.  Genitourinary: Negative.  Negative for dysuria and hematuria.  Musculoskeletal: Negative.  Negative for back pain, myalgias and neck pain.  Skin: Negative.  Negative for rash.  Neurological: Negative.  Negative for dizziness and headaches.  All other systems reviewed and are negative.  Today's Vitals   08/26/19 0822  BP: 127/76  Pulse: 74  Resp: 16  Temp: 98.3 F (36.8 C)  TempSrc: Temporal  SpO2: 97%  Weight: 184 lb (83.5 kg)  Height: 5' 5.75" (1.67 m)   Body mass index  is 29.93 kg/m.   Physical Exam Vitals reviewed.  Constitutional:      Appearance: Normal appearance.  HENT:     Head: Normocephalic.     Mouth/Throat:     Mouth: Mucous membranes are moist.     Pharynx: Oropharynx is clear.  Eyes:     Extraocular Movements: Extraocular movements intact.     Conjunctiva/sclera: Conjunctivae normal.     Pupils: Pupils are equal, round, and reactive to light.  Cardiovascular:     Rate and Rhythm: Normal rate and regular rhythm.     Pulses: Normal pulses.     Heart sounds: Normal heart sounds.  Pulmonary:     Effort: Pulmonary effort is normal.     Breath sounds: Normal breath sounds.  Abdominal:     General: Bowel sounds are normal. There is no distension.     Palpations: Abdomen is soft. There is no mass.     Tenderness: There is no abdominal tenderness.  Musculoskeletal:        General: Normal range of motion.     Cervical back: Normal range of motion and neck supple.  Skin:    General: Skin is warm and dry.     Capillary Refill: Capillary refill takes less than 2 seconds.  Neurological:     General: No focal deficit present.     Mental Status: He is alert and oriented to person, place, and time.  Psychiatric:        Mood and Affect: Mood normal.        Behavior: Behavior normal.      ASSESSMENT & PLAN: Clinically stable.  No medical concerns identified during this visit.  Continue present medications. Follow-up in 6 to 12 months.  Kyle Hancock was seen today for hypertension.  Diagnoses and all orders for this visit:  Essential hypertension -     CBC with Differential/Platelet -     Comprehensive metabolic panel -     Uric Acid -     losartan (COZAAR) 50 MG tablet; Take 1 tablet (50 mg total) by mouth daily.  Dyslipidemia -     Lipid panel  History of COVID-19 -     SAR CoV2 Serology (COVID 19)AB(IGG)IA    Patient Instructions       If you have lab work done today you will be contacted with your lab results within  the next 2 weeks.  If you have not heard from [place] then please contact us. The fastest way to get your results is to register for My Chart.   IF you received an x-ray today, you will receive an invoice from [facility] Radiology. Please contact [facility] Radiology at [phone] with questions or concerns regarding your invoice.   IF you received labwork today, you will receive an invoice from [place]. Please  contact [facility] at [phone] with questions or concerns regarding your invoice.   Our billing staff will not be able to assist you with questions regarding bills from these companies.  You will be contacted with the lab results as soon as they are available. The fastest way to get your results is to activate your My Chart account. Instructions are located on the last page of this paperwork. If you have not heard from [place] regarding the results in 2 weeks, please contact this office.     Hipertensin en los adultos Hypertension, Adult El trmino hipertensin es otra forma de denominar a la presin arterial elevada. La presin arterial elevada fuerza al corazn a trabajar ms para bombear la sangre. Esto puede causar problemas con el paso del [place]. Una lectura de presin arterial est compuesta por 2 nmeros. Hay un nmero superior (sistlico) sobre un nmero inferior (diastlico). Lo ideal es tener la presin arterial por debajo de 120/80. Las elecciones saludables pueden ayudar a Engineer, materials presin arterial, o tal vez necesite medicamentos para bajarla. Cules son las causas? Se desconoce la causa de esta afeccin. Algunas afecciones pueden estar relacionadas con la presin arterial alta. Qu incrementa el riesgo?  Fumar.  Tener diabetes mellitus tipo 2, colesterol alto, o ambos.  No hacer la cantidad suficiente de actividad fsica o ejercicio.  Tener sobrepeso.  Consumir mucha grasa, azcar, caloras o sal (sodio) en su dieta.  Beber alcohol en exceso.  Tener una enfermedad  renal a largo plazo (crnica).  Tener antecedentes familiares de presin arterial alta.  Edad. Los riesgos aumentan con la edad.  Raza. El riesgo es mayor para las Retail banker.  Sexo. Antes de los 45aos, los hombres corren ms [employer]. Despus de los 65aos, las mujeres corren ms [employer].  Tener apnea obstructiva del sueo.  Estrs. Cules son los signos o los sntomas?  Es posible que la presin arterial alta puede no cause sntomas. La presin arterial muy alta (crisis hipertensiva) puede provocar: ? Dolor de [place]. ? Sensaciones de preocupacin o nerviosismo (ansiedad). ? Falta de aire. ? Hemorragia nasal. ? Sensacin de Engineer, site (nuseas). ? Vmitos. ? Cambios en la forma de ver. ? Dolor muy intenso en el pecho. ? Convulsiones. Cmo se trata?  Esta afeccin se trata haciendo cambios saludables en el estilo de vida, por ejemplo: ? Consumir alimentos saludables. ? Hacer ms ejercicio. ? Beber menos alcohol.  El mdico puede recetarle medicamentos si los cambios en el estilo de vida no son suficientes para Child psychotherapist la presin arterial y si: ? El nmero de arriba est por encima de 130. ? El nmero de abajo est por encima de 80.  Su presin arterial personal ideal puede variar. Siga estas instrucciones en su casa: Comida y bebida   Si se lo dicen, siga el plan de alimentacin de DASH (Dietary Approaches to Stop Hypertension, Maneras de alimentarse para detener la hipertensin). Para seguir este plan: ? Llene la mitad del plato de cada comida con frutas y verduras. ? Llene un cuarto del plato de cada comida con cereales integrales. Los cereales integrales incluyen pasta integral, arroz integral y pan integral. ? Coma y beba productos lcteos con bajo contenido de grasa, como leche descremada o yogur bajo en grasas. ? Llene un cuarto del plato de cada comida con protenas bajas en grasa (magras). Las  protenas bajas en grasa incluyen pescado, pollo sin piel, huevos, frijoles y tofu. ? Evite consumir carne  grasa, carne curada y procesada, o pollo con piel. ? Evite consumir alimentos prehechos o procesados.  Consuma menos de 1500 mg de sal por da.  No beba alcohol si: ? El mdico le indica que no lo haga. ? Est embarazada, puede estar embarazada o est tratando de quedar embarazada.  Si bebe alcohol: ? Limite la cantidad que bebe a lo siguiente:  De 0 a 1 medida por da para las mujeres.  De 0 a 2 medidas por da para los hombres. ? Est atento a la cantidad de alcohol que hay en las bebidas que toma. En los [place], una medida equivale a una botella de cerveza de 12oz ( ), un vaso de vino de 5oz ( ) o un vaso de una bebida alcohlica de alta graduacin de 1oz (81ml). Estilo de vida   Trabaje con su mdico para mantenerse en un peso saludable o para perder peso. Pregntele a su mdico cul es el peso recomendable para usted.  Haga al menos de ejercicio la [employer] de la [place]. Estos pueden incluir caminar, nadar o andar en bicicleta.  Realice al menos 30 minutos de ejercicio que fortalezca sus msculos (ejercicios de resistencia) al menos 3 das a la [place]. Estos pueden incluir levantar pesas o hacer Pilates.  No consuma ningn producto que contenga nicotina o tabaco, como cigarrillos, cigarrillos electrnicos y tabaco de Theatre manager. Si necesita ayuda para dejar de fumar, consulte al [employer].  Controle su presin arterial en su casa tal como le indic el mdico.  Concurra a todas las visitas de seguimiento como se lo haya indicado el mdico. Esto es importante. Medicamentos  [employer] de venta libre y los recetados solamente como se lo haya indicado el mdico. Siga cuidadosamente las indicaciones.  No omita las dosis de medicamentos para la presin arterial. Los medicamentos pierden eficacia si omite dosis. El hecho de omitir  las dosis tambin [place] el riesgo de otros problemas.  Pregntele a su mdico a qu efectos secundarios o reacciones a los Museum/gallery curator. Comunquese con un mdico si:  Piensa que tiene [place] reaccin a los medicamentos que est tomando.  Tiene dolores de cabeza frecuentes (recurrentes).  Se siente mareado.  Tiene hinchazn en los tobillos.  Tiene problemas de visin. Solicite ayuda inmediatamente si:  Siente un dolor de cabeza muy intenso.  Empieza a sentirse desorientado (confundido).  Se siente dbil o adormecido.  Siente que va a desmayarse.  Tiene un dolor muy intenso en las siguientes zonas: ? Pecho. ? Vientre (abdomen).  Vomita ms de una vez.  Tiene dificultad para respirar. Resumen  El trmino hipertensin es otra forma de denominar a la presin arterial elevada.  La presin arterial elevada fuerza al corazn a trabajar ms para bombear la sangre.  Para la [employer], una presin arterial normal es menor que 120/80.  Las decisiones saludables pueden ayudarle a disminuir su presin arterial. Si no puede bajar su presin arterial mediante decisiones saludables, es posible que deba tomar medicamentos. Esta informacin no tiene Theme park manager el consejo del mdico. Asegrese de hacerle al mdico cualquier pregunta que tenga. Document Revised: 03/14/2018 Document Reviewed: 03/14/2018 Elsevier Patient Education  2020 Elsevier Inc.     Edwina Barth, MD [facility]

## 2019-08-27 ENCOUNTER — Encounter: Payer: Self-pay | Admitting: Emergency Medicine

## 2019-08-27 ENCOUNTER — Telehealth: Payer: Self-pay | Admitting: Emergency Medicine

## 2019-08-27 LAB — COMPREHENSIVE METABOLIC PANEL
ALT: 48 IU/L — ABNORMAL HIGH (ref 0–44)
AST: 32 IU/L (ref 0–40)
Albumin/Globulin Ratio: 1.8 (ref 1.2–2.2)
Albumin: 4.6 g/dL (ref 4.0–5.0)
Alkaline Phosphatase: 78 IU/L (ref 39–117)
BUN/Creatinine Ratio: 15 (ref 9–20)
BUN: 15 mg/dL (ref 6–24)
Bilirubin Total: 0.5 mg/dL (ref 0.0–1.2)
CO2: 25 mmol/L (ref 20–29)
Calcium: 10.4 mg/dL — ABNORMAL HIGH (ref 8.7–10.2)
Chloride: 103 mmol/L (ref 96–106)
Creatinine, Ser: 1.02 mg/dL (ref 0.76–1.27)
GFR calc Af Amer: 104 mL/min/{1.73_m2} (ref >59)
GFR calc non Af Amer: 90 mL/min/{1.73_m2} (ref >59)
Globulin, Total: 2.5 g/dL (ref 1.5–4.5)
Glucose: 87 mg/dL (ref 65–99)
Potassium: 4.6 mmol/L (ref 3.5–5.2)
Sodium: 139 mmol/L (ref 134–144)
Total Protein: 7.1 g/dL (ref 6.0–8.5)

## 2019-08-27 LAB — CBC WITH DIFFERENTIAL/PLATELET
Basophils Absolute: 0.1 10*3/uL (ref 0.0–0.2)
Basos: 1 %
EOS (ABSOLUTE): 0 10*3/uL (ref 0.0–0.4)
Eos: 1 %
Hematocrit: 43.4 % (ref 37.5–51.0)
Hemoglobin: 14.5 g/dL (ref 13.0–17.7)
Immature Grans (Abs): 0 10*3/uL (ref 0.0–0.1)
Immature Granulocytes: 0 %
Lymphocytes Absolute: 2 10*3/uL (ref 0.7–3.1)
Lymphs: 41 %
MCH: 25.8 pg — ABNORMAL LOW (ref 26.6–33.0)
MCHC: 33.4 g/dL (ref 31.5–35.7)
MCV: 77 fL — ABNORMAL LOW (ref 79–97)
Monocytes Absolute: 0.4 10*3/uL (ref 0.1–0.9)
Monocytes: 7 %
Neutrophils Absolute: 2.5 10*3/uL (ref 1.4–7.0)
Neutrophils: 50 %
Platelets: 230 10*3/uL (ref 150–450)
RBC: 5.61 x10E6/uL (ref 4.14–5.80)
RDW: 13 % (ref 11.6–15.4)
WBC: 5 10*3/uL (ref 3.4–10.8)

## 2019-08-27 LAB — SAR COV2 SEROLOGY (COVID19)AB(IGG),IA: DiaSorin SARS-CoV-2 Ab, IgG: NEGATIVE

## 2019-08-27 LAB — URIC ACID: Uric Acid: 5.4 mg/dL (ref 3.8–8.4)

## 2019-08-27 LAB — LIPID PANEL
Chol/HDL Ratio: 2.9 ratio (ref 0.0–5.0)
Cholesterol, Total: 202 mg/dL — ABNORMAL HIGH (ref 100–199)
HDL: 69 mg/dL (ref >39)
LDL Chol Calc (NIH): 120 mg/dL — ABNORMAL HIGH (ref 0–99)
Triglycerides: 72 mg/dL (ref 0–149)
VLDL Cholesterol Cal: 13 mg/dL (ref 5–40)

## 2019-08-27 NOTE — Telephone Encounter (Signed)
Blood results discussed with patient. 

## 2019-08-29 NOTE — Telephone Encounter (Signed)
Please advise message is in spanish

## 2019-08-30 NOTE — Telephone Encounter (Signed)
Spoke to patient. Thanks

## 2020-05-24 ENCOUNTER — Ambulatory Visit (INDEPENDENT_AMBULATORY_CARE_PROVIDER_SITE_OTHER): Payer: BLUE CROSS/BLUE SHIELD | Admitting: Emergency Medicine

## 2020-05-24 ENCOUNTER — Encounter: Payer: Self-pay | Admitting: Emergency Medicine

## 2020-05-24 ENCOUNTER — Other Ambulatory Visit: Payer: Self-pay

## 2020-05-24 VITALS — BP 129/72 | HR 77 | Temp 98.6°F | Resp 16 | Ht 66.0 in | Wt 181.0 lb

## 2020-05-24 DIAGNOSIS — K6289 Other specified diseases of anus and rectum: Secondary | ICD-10-CM | POA: Diagnosis not present

## 2020-05-24 DIAGNOSIS — K644 Residual hemorrhoidal skin tags: Secondary | ICD-10-CM

## 2020-05-24 MED ORDER — HYDROCORTISONE (PERIANAL) 2.5 % EX CREA: 1.0000 "application " | TOPICAL_CREAM | Freq: Two times a day (BID) | CUTANEOUS | 5 refills | Status: AC

## 2020-05-24 NOTE — Progress Notes (Signed)
Kyle Hancock 44 y.o.   Chief Complaint  Patient presents with  . Hemorrhoids    Per patient started last Wednesday, bump on the right side and uncomfortable to sit down    HISTORY OF PRESENT ILLNESS: This is a 44 y.o. male complaining of painful hemorrhoid that started last week and is now better. Denies rectal bleeding or any other associated symptoms. No other complaints or medical concerns today.  HPI   Prior to Admission medications   Medication Sig Start Date End Date Taking? Authorizing Provider  losartan (COZAAR) 50 MG tablet Take 1 tablet (50 mg total) by mouth daily. 08/26/19  Yes Edrik Rundle, Eilleen Kempf, MD  Cyanocobalamin (VITAMIN B 12 PO) Take by mouth daily. Patient not taking: Reported on 05/24/2020    [provider]    No Known Allergies  Patient Active Problem List   Diagnosis Date Noted  . Dyslipidemia 08/26/2019  . Dizziness 06/19/2018  . Essential hypertension 06/19/2018    History reviewed. No pertinent past medical history.  History reviewed. No pertinent surgical history.  Social History   Socioeconomic History  . Marital status: Married    Spouse name: Not on file  . Number of children: Not on file  . Years of education: Not on file  . Highest education level: Not on file  Occupational History  . Not on file  Tobacco Use  . Smoking status: Never Smoker  . Smokeless tobacco: Never Used  Substance and Sexual Activity  . Alcohol use: Not on file  . Drug use: Not on file  . Sexual activity: Not on file  Other Topics Concern  . Not on file  Social History Narrative  . Not on file   Social Determinants of Health   Financial Resource Strain: Not on file  Food Insecurity: Not on file  Transportation Needs: Not on file  Physical Activity: Not on file  Stress: Not on file  Social Connections: Not on file  Intimate Partner Violence: Not on file    History reviewed. No pertinent family history.   Review of  Systems  Constitutional: Negative.  Negative for chills and fever.  HENT: Negative.  Negative for congestion and sore throat.   Respiratory: Negative.  Negative for cough and shortness of breath.   Cardiovascular: Negative.  Negative for chest pain and palpitations.  Gastrointestinal: Negative.  Negative for abdominal pain, diarrhea, nausea and vomiting.  Genitourinary: Negative.  Negative for dysuria and hematuria.  Skin: Negative.  Negative for rash.  Neurological: Negative.  Negative for dizziness and headaches.  All other systems reviewed and are negative.     Today's Vitals   05/24/20 1417  BP: 129/72  Pulse: 77  Resp: 16  Temp: 98.6 F (37 C)  TempSrc: Temporal  SpO2: 98%  Weight: 181 lb (82.1 kg)  Height: 5\' 6"  (1.676 m)   Body mass index is 29.21 kg/m.  Physical Exam Vitals reviewed.  Constitutional:      Appearance: Normal appearance.  HENT:     Head: Normocephalic.  Eyes:     Extraocular Movements: Extraocular movements intact.     Pupils: Pupils are equal, round, and reactive to light.  Cardiovascular:     Rate and Rhythm: Normal rate and regular rhythm.     Heart sounds: Normal heart sounds.  Pulmonary:     Effort: Pulmonary effort is normal.     Breath sounds: Normal breath sounds.  Genitourinary:    Rectum: External hemorrhoid present. No tenderness or anal  fissure.  Musculoskeletal:        General: Normal range of motion.     Cervical back: Normal range of motion.  Skin:    General: Skin is warm and dry.     Capillary Refill: Capillary refill takes less than 2 seconds.  Neurological:     General: No focal deficit present.     Mental Status: He is alert and oriented to person, place, and time.  Psychiatric:        Mood and Affect: Mood normal.        Behavior: Behavior normal.      ASSESSMENT & PLAN: Kyle Hancock was seen today for hemorrhoids.  Diagnoses and all orders for this visit:  External hemorrhoid -     hydrocortisone (ANUSOL-HC)  2.5 % rectal cream; Place 1 application rectally 2 (two) times daily.  Rectal pain    Patient Instructions       If you have lab work done today you will be contacted with your lab results within the next 2 weeks.  If you have not heard from [place] then please contact us. The fastest way to get your results is to register for My Chart.   IF you received an x-ray today, you will receive an invoice from [facility] Radiology. Please contact [facility] Radiology at [phone] with questions or concerns regarding your invoice.   IF you received labwork today, you will receive an invoice from [place]. Please contact [facility] at [phone] with questions or concerns regarding your invoice.   Our billing staff will not be able to assist you with questions regarding bills from these companies.  You will be contacted with the lab results as soon as they are available. The fastest way to get your results is to activate your My Chart account. Instructions are located on the last page of this paperwork. If you have not heard from [place] regarding the results in 2 weeks, please contact this office.     Hemorroides Hemorrhoids Las hemorroides son venas inflamadas que pueden desarrollarse:  En el ano (recto). Estas se denominan hemorroides internas.  Alrededor de la abertura del ano. Estas se denominan hemorroides externas. Las hemorroides pueden causar dolor, picazn o hemorragias. Generalmente no causan problemas graves. Con frecuencia mejoran al [employer] dieta, el estilo de vida y otros tratamientos Facilities manager. Cules son las causas? Esta afeccin puede ser causada por lo siguiente:  Tener dificultad para defecar (estreimiento).  Hacer mucha fuerza (esfuerzo) para defecar.  Materia fecal lquida (diarrea).  Embarazo.  Tener mucho sobrepeso (obesidad).  Estar sentado durante largos perodos de [place].  Levantar objetos pesados u otras actividades que impliquen esfuerzo.  Sexo  anal.  Andar en bicicleta por un largo perodo de tiempo. Cules son los signos o los sntomas? Los sntomas de esta afeccin incluyen los siguientes:  Engineer, mining.  Picazn o irritacin en el ano.  Sangrado proveniente del ano.  Prdida de materia fecal.  Inflamacin en la zona.  Uno o ms bultos alrededor de la abertura del ano. Cmo se diagnostica? A menudo un mdico puede diagnosticar esta afeccin al observar la zona afectada. El mdico tambin puede:  Education officer, environmental un examen que implica palpar la zona con la mano enguantada (examen rectal digital).  Examinar el interior de la zona anal utilizando un pequeo tubo (anoscopio).  Pedir anlisis de [place]. Es posible que esto se realice si ha perdido Warden/ranger.  Solicitarle un estudio que consiste en la observacin del interior del colon utilizando un tubo  flexible con Posey Boyer en el extremo (sigmoidoscopia o colonoscopa). Cmo se trata? Esta afeccin generalmente se puede tratar en el hogar. El mdico puede indicarle que cambie de Paediatric nurse, de estilo de vida o que trate de Psychologist, sport and exercise. Si esto no da resultado, se pueden realizar procedimientos para extirpar las hemorroides o reducir Brewing technologist. Estos pueden implicar lo siguiente:  Museum/gallery conservator en la base de las hemorroides para interrumpir la irrigacin de Risk manager.  Inyectar un medicamento en las hemorroides para reducir Brewing technologist.  Dirigir un tipo de Engineer, drilling de luz hacia las hemorroides para Radio producer que se caigan.  Realizar Neomia Dear ciruga para extirpar las hemorroides o cortar la irrigacin de [place]. Siga estas indicaciones en su casa: Comida y bebida   Consuma alimentos con alto contenido de [place]. Entre ellos cereales integrales, frijoles, frutos secos, frutas y verduras.  Pregntele a su mdico acerca de tomar productos con fibra aadida en ellos (complementos defibra).  Disminuya la cantidad de grasa de la dieta. Para esto, puede  hacer lo siguiente: ? Coma productos lcteos descremados. ? Coma menos carne roja. ? No consuma alimentos procesados.  Beba suficiente lquido para Photographer orina de color amarillo plido. Control del dolor y la hinchazn   Tome un bao de agua tibia (bao de asiento) durante 20 minutos para Engineer, materials. Hgalo 3 o 4veces al da. Puede hacer esto en una baera o usar un dispositivo porttil para bao de asiento que se coloca sobre el inodoro.  Si se lo indican, aplique hielo sobre la zona dolorida. Puede ser beneficioso aplicar hielo [employer] baos con agua tibia. ? Ponga el hielo en una bolsa plstica. ? Coloque una [employer] piel y Copy. ? Coloque el hielo durante , 2 a 3veces por da. Indicaciones generales  [employer] de venta libre y los recetados solamente como se lo haya indicado el mdico. ? Las [employer] Net y los medicamentos pueden usarse como se lo hayan indicado.  Haga ejercicio fsico con frecuencia. Consulte al mdico qu tipos de ejercicios son mejores para usted y qu cantidad.  Vaya al bao cuando sienta ganas de defecar. No espere.  Evite hacer demasiada fuerza al defecar.  Mantenga el ano seco y limpio. Use papel higinico hmedo o toallitas humedecidas despus de defecar.  No pase mucho tiempo sentado en el inodoro.  Concurra a todas las visitas de [address] se lo haya indicado el mdico. Esto es importante. Comunquese con un mdico si:  Tiene dolor e hinchazn que no mejoran con el tratamiento o los medicamentos.  Tiene problemas para defecar.  No puede defecar.  Tiene dolor o hinchazn en la zona exterior de las hemorroides. Solicite ayuda inmediatamente si tiene:  Hemorragia que no se detiene. Resumen  Las hemorroides son venas hinchadas en el ano o la zona que rodea el ano.  Pueden causar dolor, picazn o sangrado.  Consuma alimentos con alto contenido de [place]. Entre ellos cereales  integrales, frijoles, frutos secos, frutas y verduras.  Tome un bao de agua tibia (bao de asiento) durante 20 minutos para Engineer, materials. Hgalo 3 o 4veces al da. Esta informacin no tiene Theme park manager el consejo del mdico. Asegrese de hacerle al mdico cualquier pregunta que tenga. Document Revised: 12/06/2017 Document Reviewed: 12/06/2017 Elsevier Patient Education  2020 Elsevier Inc.      Edwina Barth, MD [facility]

## 2020-05-24 NOTE — Patient Instructions (Addendum)
   If you have lab work done today you will be contacted with your lab results within the next 2 weeks.  If you have not heard from us then please contact us. The fastest way to get your results is to register for My Chart.   IF you received an x-ray today, you will receive an invoice from [facility]. Please contact [facility] at [phone] with questions or concerns regarding your invoice.   IF you received labwork today, you will receive an invoice from [facility]. Please contact [facility] at [phone] with questions or concerns regarding your invoice.   Our billing staff will not be able to assist you with questions regarding bills from these companies.  You will be contacted with the lab results as soon as they are available. The fastest way to get your results is to activate your My Chart account. Instructions are located on the last page of this paperwork. If you have not heard from us regarding the results in 2 weeks, please contact this office.     Hemorroides Hemorrhoids Las hemorroides son venas inflamadas que pueden desarrollarse:  En el ano (recto). Estas se denominan hemorroides internas.  Alrededor de la abertura del ano. Estas se denominan hemorroides externas. Las hemorroides pueden causar dolor, picazn o hemorragias. Generalmente no causan problemas graves. Con frecuencia mejoran al cambiar la dieta, el estilo de vida y otros tratamientos en el hogar. Cules son las causas? Esta afeccin puede ser causada por lo siguiente:  Tener dificultad para defecar (estreimiento).  Hacer mucha fuerza (esfuerzo) para defecar.  Materia fecal lquida (diarrea).  Embarazo.  Tener mucho sobrepeso (obesidad).  Estar sentado durante largos perodos de tiempo.  Levantar objetos pesados u otras actividades que impliquen esfuerzo.  Sexo anal.  Andar en bicicleta por un largo perodo de tiempo. Cules son los signos o los sntomas? Los sntomas de  esta afeccin incluyen los siguientes:  Dolor.  Picazn o irritacin en el ano.  Sangrado proveniente del ano.  Prdida de materia fecal.  Inflamacin en la zona.  Uno o ms bultos alrededor de la abertura del ano. Cmo se diagnostica? A menudo un mdico puede diagnosticar esta afeccin al observar la zona afectada. El mdico tambin puede:  Realizar un examen que implica palpar la zona con la mano enguantada (examen rectal digital).  Examinar el interior de la zona anal utilizando un pequeo tubo (anoscopio).  Pedir anlisis de sangre. Es posible que esto se realice si ha perdido mucha sangre.  Solicitarle un estudio que consiste en la observacin del interior del colon utilizando un tubo flexible con una cmara en el extremo (sigmoidoscopia o colonoscopa). Cmo se trata? Esta afeccin generalmente se puede tratar en el hogar. El mdico puede indicarle que cambie de alimentacin, de estilo de vida o que trate de realizar tratamientos en el hogar. Si esto no da resultado, se pueden realizar procedimientos para extirpar las hemorroides o reducir su tamao. Estos pueden implicar lo siguiente:  Colocar bandas elsticas en la base de las hemorroides para interrumpir la irrigacin de la sangre.  Inyectar un medicamento en las hemorroides para reducir su tamao.  Dirigir un tipo de energa de luz hacia las hemorroides para hacer que se caigan.  Realizar una ciruga para extirpar las hemorroides o cortar la irrigacin de sangre. Siga estas indicaciones en su casa: Comida y bebida   Consuma alimentos con alto contenido de fibra. Entre ellos cereales integrales, frijoles, frutos secos, frutas y verduras.  Pregntele a su mdico   acerca de tomar productos con fibra aadida en ellos (complementos defibra).  Disminuya la cantidad de grasa de la dieta. Para esto, puede hacer lo siguiente: ? Coma productos lcteos descremados. ? Coma menos carne roja. ? No consuma alimentos  procesados.  Beba suficiente lquido para mantener la orina de color amarillo plido. Control del dolor y la hinchazn   Tome un bao de agua tibia (bao de asiento) durante 20 minutos para aliviar el dolor. Hgalo 3 o 4veces al da. Puede hacer esto en una baera o usar un dispositivo porttil para bao de asiento que se coloca sobre el inodoro.  Si se lo indican, aplique hielo sobre la zona dolorida. Puede ser beneficioso aplicar hielo entre los baos con agua tibia. ? Ponga el hielo en una bolsa plstica. ? Coloque una toalla entre la piel y la bolsa. ? Coloque el hielo durante 20minutos, 2 a 3veces por da. Indicaciones generales  Tome los medicamentos de venta libre y los recetados solamente como se lo haya indicado el mdico. ? Las cremas medicinales y los medicamentos pueden usarse como se lo hayan indicado.  Haga ejercicio fsico con frecuencia. Consulte al mdico qu tipos de ejercicios son mejores para usted y qu cantidad.  Vaya al bao cuando sienta ganas de defecar. No espere.  Evite hacer demasiada fuerza al defecar.  Mantenga el ano seco y limpio. Use papel higinico hmedo o toallitas humedecidas despus de defecar.  No pase mucho tiempo sentado en el inodoro.  Concurra a todas las visitas de seguimiento como se lo haya indicado el mdico. Esto es importante. Comunquese con un mdico si:  Tiene dolor e hinchazn que no mejoran con el tratamiento o los medicamentos.  Tiene problemas para defecar.  No puede defecar.  Tiene dolor o hinchazn en la zona exterior de las hemorroides. Solicite ayuda inmediatamente si tiene:  Hemorragia que no se detiene. Resumen  Las hemorroides son venas hinchadas en el ano o la zona que rodea el ano.  Pueden causar dolor, picazn o sangrado.  Consuma alimentos con alto contenido de fibra. Entre ellos cereales integrales, frijoles, frutos secos, frutas y verduras.  Tome un bao de agua tibia (bao de asiento) durante 20  minutos para aliviar el dolor. Hgalo 3 o 4veces al da. Esta informacin no tiene como fin reemplazar el consejo del mdico. Asegrese de hacerle al mdico cualquier pregunta que tenga. Document Revised: 12/06/2017 Document Reviewed: 12/06/2017 Elsevier Patient Education  2020 [employer]  

## 2020-12-21 ENCOUNTER — Ambulatory Visit: Payer: Self-pay | Admitting: Emergency Medicine

## 2021-05-10 ENCOUNTER — Other Ambulatory Visit: Payer: Self-pay | Admitting: Emergency Medicine

## 2021-05-10 DIAGNOSIS — I10 Essential (primary) hypertension: Secondary | ICD-10-CM

## 2021-05-10 MED ORDER — LOSARTAN POTASSIUM 50 MG PO TABS: 50.0000 mg | ORAL_TABLET | Freq: Every day | ORAL | 3 refills | Status: DC

## 2021-07-07 ENCOUNTER — Other Ambulatory Visit: Payer: Self-pay

## 2021-07-07 ENCOUNTER — Ambulatory Visit (INDEPENDENT_AMBULATORY_CARE_PROVIDER_SITE_OTHER): Payer: BC Managed Care – PPO | Admitting: Emergency Medicine

## 2021-07-07 ENCOUNTER — Encounter: Payer: Self-pay | Admitting: Emergency Medicine

## 2021-07-07 VITALS — BP 126/80 | HR 85 | Ht 66.0 in | Wt 185.0 lb

## 2021-07-07 DIAGNOSIS — Z9889 Other specified postprocedural states: Secondary | ICD-10-CM | POA: Diagnosis not present

## 2021-07-07 DIAGNOSIS — I10 Essential (primary) hypertension: Secondary | ICD-10-CM | POA: Diagnosis not present

## 2021-07-07 DIAGNOSIS — Z23 Encounter for immunization: Secondary | ICD-10-CM | POA: Diagnosis not present

## 2021-07-07 DIAGNOSIS — Z130 Encounter for screening for diseases of the blood and blood-forming organs and certain disorders involving the immune mechanism: Secondary | ICD-10-CM | POA: Diagnosis not present

## 2021-07-07 DIAGNOSIS — E785 Hyperlipidemia, unspecified: Secondary | ICD-10-CM

## 2021-07-07 DIAGNOSIS — Z1329 Encounter for screening for other suspected endocrine disorder: Secondary | ICD-10-CM

## 2021-07-07 DIAGNOSIS — Z789 Other specified health status: Secondary | ICD-10-CM

## 2021-07-07 DIAGNOSIS — Z13220 Encounter for screening for lipoid disorders: Secondary | ICD-10-CM

## 2021-07-07 DIAGNOSIS — Z0000 Encounter for general adult medical examination without abnormal findings: Secondary | ICD-10-CM

## 2021-07-07 DIAGNOSIS — Z13228 Encounter for screening for other metabolic disorders: Secondary | ICD-10-CM | POA: Diagnosis not present

## 2021-07-07 DIAGNOSIS — Z113 Encounter for screening for infections with a predominantly sexual mode of transmission: Secondary | ICD-10-CM

## 2021-07-07 LAB — CBC WITH DIFFERENTIAL/PLATELET
Basophils Absolute: 0.1 10*3/uL (ref 0.0–0.1)
Basophils Relative: 1 % (ref 0.0–3.0)
Eosinophils Absolute: 0.1 10*3/uL (ref 0.0–0.7)
Eosinophils Relative: 0.9 % (ref 0.0–5.0)
HCT: 43 % (ref 39.0–52.0)
Hemoglobin: 13.9 g/dL (ref 13.0–17.0)
Lymphocytes Relative: 38 % (ref 12.0–46.0)
Lymphs Abs: 2.2 10*3/uL (ref 0.7–4.0)
MCHC: 32.3 g/dL (ref 30.0–36.0)
MCV: 77.8 fl — ABNORMAL LOW (ref 78.0–100.0)
Monocytes Absolute: 0.4 10*3/uL (ref 0.1–1.0)
Monocytes Relative: 6.3 % (ref 3.0–12.0)
Neutro Abs: 3.1 10*3/uL (ref 1.4–7.7)
Neutrophils Relative %: 53.8 % (ref 43.0–77.0)
Platelets: 193 10*3/uL (ref 150.0–400.0)
RBC: 5.53 Mil/uL (ref 4.22–5.81)
RDW: 13.6 % (ref 11.5–15.5)
WBC: 5.8 10*3/uL (ref 4.0–10.5)

## 2021-07-07 LAB — COMPREHENSIVE METABOLIC PANEL
ALT: 39 U/L (ref 0–53)
AST: 22 U/L (ref 0–37)
Albumin: 4.6 g/dL (ref 3.5–5.2)
Alkaline Phosphatase: 66 U/L (ref 39–117)
BUN: 17 mg/dL (ref 6–23)
CO2: 29 mEq/L (ref 19–32)
Calcium: 9.9 mg/dL (ref 8.4–10.5)
Chloride: 103 mEq/L (ref 96–112)
Creatinine, Ser: 1.55 mg/dL — ABNORMAL HIGH (ref 0.40–1.50)
GFR: 54.15 mL/min — ABNORMAL LOW (ref >60.00)
Glucose, Bld: 95 mg/dL (ref 70–99)
Potassium: 3.7 mEq/L (ref 3.5–5.1)
Sodium: 138 mEq/L (ref 135–145)
Total Bilirubin: 0.4 mg/dL (ref 0.2–1.2)
Total Protein: 7.4 g/dL (ref 6.0–8.3)

## 2021-07-07 LAB — LIPID PANEL
Cholesterol: 188 mg/dL (ref 0–200)
HDL: 62 mg/dL (ref >39.00)
LDL Cholesterol: 97 mg/dL (ref 0–99)
NonHDL: 125.87
Total CHOL/HDL Ratio: 3
Triglycerides: 146 mg/dL (ref 0.0–149.0)
VLDL: 29.2 mg/dL (ref 0.0–40.0)

## 2021-07-07 NOTE — Patient Instructions (Signed)
Mantenimiento de la salud en los hombres °Health Maintenance, Male °Adoptar un estilo de vida saludable y recibir atención preventiva son importantes para promover la salud y el bienestar. Consulte al médico sobre: °El esquema adecuado para hacerse pruebas y exámenes periódicos. °Cosas que puede hacer por su cuenta para prevenir enfermedades y mantenerse sano. °¿Qué debo saber sobre la dieta, el peso y el ejercicio? °Consuma una dieta saludable ° °Consuma una dieta que incluya muchas verduras, frutas, productos lácteos con bajo contenido de grasa y proteínas magras. °No consuma muchos alimentos ricos en grasas sólidas, azúcares agregados o sodio. °Mantenga un peso saludable °El índice de masa muscular (IMC) es una medida que puede utilizarse para identificar posibles problemas de peso. Proporciona una estimación de la grasa corporal basándose en el peso y la altura. Su médico puede ayudarle a determinar su IMC y a lograr o mantener un peso saludable. °Haga ejercicio con regularidad °Haga ejercicio con regularidad. Esta es una de las prácticas más importantes que puede hacer por su salud. La mayoría de los adultos deben seguir estas pautas: °Realizar, al menos, 150 minutos de actividad física por semana. El ejercicio debe aumentar la frecuencia cardíaca y hacerlo transpirar (ejercicio de intensidad moderada). °Hacer ejercicios de fortalecimiento por lo menos dos veces por semana. Agregue esto a su plan de ejercicio de intensidad moderada. °Pase menos tiempo sentado. Incluso la actividad física ligera puede ser beneficiosa. °Controle sus niveles de colesterol y lípidos en la sangre °Comience a realizarse análisis de lípidos y colesterol en la sangre a los 20 años y luego repítalos cada 5 años. °Es posible que necesite controlar los niveles de colesterol con mayor frecuencia si: °Sus niveles de lípidos y colesterol son altos. °Es mayor de 40 años. °Presenta un alto riesgo de padecer enfermedades cardíacas. °¿Qué debo  saber sobre las pruebas de detección del cáncer? °Muchos tipos de cáncer pueden detectarse de manera temprana y, a menudo, pueden prevenirse. Según su historia clínica y sus antecedentes familiares, es posible que deba realizarse pruebas de detección del cáncer en diferentes edades. Esto puede incluir pruebas de detección de lo siguiente: °Cáncer colorrectal. °Cáncer de próstata. °Cáncer de piel. °Cáncer de pulmón. °¿Qué debo saber sobre la enfermedad cardíaca, la diabetes y la hipertensión arterial? °Presión arterial y enfermedad cardíaca °La hipertensión arterial causa enfermedades cardíacas y aumenta el riesgo de accidente cerebrovascular. Es más probable que esto se manifieste en las personas que tienen lecturas de presión arterial alta o tienen sobrepeso. °Hable con el médico sobre sus valores de presión arterial deseados. °Hágase controlar la presión arterial: °Cada 3 a 5 años si tiene entre 18 y 39 años. °Todos los años si es mayor de 40 años. °Si tiene entre 65 y 75 años y es fumador o solía fumar, pregúntele al médico si debe realizarse una prueba de detección de aneurisma aórtico abdominal (AAA) por única vez. °Diabetes °Realícese exámenes de detección de la diabetes con regularidad. Este análisis revisa el nivel de azúcar en la sangre en ayunas. Hágase las pruebas de detección: °Cada tres años después de los 45 años de edad si tiene un peso normal y un bajo riesgo de padecer diabetes. °Con más frecuencia y a partir de una edad inferior si tiene sobrepeso o un alto riesgo de padecer diabetes. °¿Qué debo saber sobre la prevención de infecciones? °Hepatitis B °Si tiene un riesgo más alto de contraer hepatitis B, debe someterse a un examen de detección de este virus. Hable con el médico para averiguar si tiene riesgo de   contraer la infección por hepatitis B. °Hepatitis C °Se recomienda un análisis de sangre para: °Todos los que nacieron entre 1945 y 1965. °Todas las personas que tengan un riesgo de haber  contraído hepatitis C. °Enfermedades de transmisión sexual (ETS) °Debe realizarse pruebas de detección de ITS todos los años, incluidas la gonorrea y la clamidia, si: °Es sexualmente activo y es menor de 24 años. °Es mayor de 24 años, y el médico le informa que corre riesgo de tener este tipo de infecciones. °La actividad sexual ha cambiado desde que le hicieron la última prueba de detección y tiene un riesgo mayor de tener clamidia o gonorrea. Pregúntele al médico si usted tiene riesgo. °Pregúntele al médico si usted tiene un alto riesgo de contraer VIH. El médico también puede recomendarle un medicamento recetado para ayudar a evitar la infección por el VIH. Si elige tomar medicamentos para prevenir el VIH, primero debe hacerse los análisis de detección del VIH. Luego debe hacerse análisis cada 3 meses mientras esté tomando los medicamentos. °Siga estas indicaciones en su casa: °Consumo de alcohol °No beba alcohol si el médico se lo prohíbe. °Si bebe alcohol: °Limite la cantidad que consume de 0 a 2 bebidas por día. °Sepa cuánta cantidad de alcohol hay en las bebidas que toma. En [place], una medida equivale a una botella de cerveza de 12 oz (355 ml), un vaso de vino de 5 oz (148 ml) o un vaso de una bebida alcohólica de alta graduación de 1½ oz (44 ml). °Estilo de vida °No consuma ningún producto que contenga nicotina o tabaco. Estos productos incluyen cigarrillos, tabaco para mascar y aparatos de vapeo, como los cigarrillos electrónicos. Si necesita ayuda para dejar de consumir estos productos, consulte al médico. °No consuma drogas. °No comparta agujas. °Solicite ayuda a su médico si necesita apoyo o información para abandonar las drogas. °Indicaciones generales °Realícese los estudios de rutina de la salud, dentales y de la vista. °Manténgase al día con las vacunas. °Infórmele a su médico si: °Se siente deprimido con frecuencia. °Alguna vez ha sido víctima de maltrato o no se siente seguro en su  casa. °Resumen °Adoptar un estilo de vida saludable y recibir atención preventiva son importantes para promover la salud y el bienestar. °Siga las instrucciones del médico acerca de una dieta saludable, el ejercicio y la realización de pruebas o exámenes para detectar enfermedades. °Siga las instrucciones del médico con respecto al control del colesterol y la presión arterial. °Esta información no tiene como fin reemplazar el consejo del médico. Asegúrese de hacerle al médico cualquier pregunta que tenga. °Document Revised: 11/03/2020 Document Reviewed: 11/03/2020 °Elsevier Patient Education © 2022 [employer] ° °

## 2021-07-07 NOTE — Progress Notes (Signed)
Kyle Hancock 46 y.o.   Chief Complaint  Patient presents with   Annual Exam    Pt would like to have referral cardio for BP concerns.     HISTORY OF PRESENT ILLNESS: This is a 46 y.o. male here for annual exam. Has a history of hypertension on losartan 50 mg daily. 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  Performed in native [place]. Has no complaints or any other medical concerns. Non-smoker.  Exercises regularly.  Healthy lifestyle. Concerned about STDs.   HPI   Prior to Admission medications   Medication Sig Start Date End Date Taking? Authorizing Provider  losartan (COZAAR) 50 MG tablet Take 1 tablet (50 mg total) by mouth daily. 05/10/21  Yes Autumn Gunn, Ines Bloomer, MD  Cyanocobalamin (VITAMIN B 12 PO) Take by mouth daily. Patient not taking: Reported on 05/24/2020    [provider]  hydrocortisone (ANUSOL-HC) 2.5 % rectal cream Place 1 application rectally 2 (two) times daily. Patient not taking: Reported on 07/07/2021 05/24/20   Horald Pollen, MD    No Known Allergies  Patient Active Problem List   Diagnosis Date Noted   Dyslipidemia 08/26/2019   Dizziness 06/19/2018   Essential hypertension 06/19/2018    History reviewed. No pertinent past medical history.  History reviewed. No pertinent surgical history.  Social History   Socioeconomic History   Marital status: Married    Spouse name: Not on file   Number of children: Not on file   Years of education: Not on file   Highest education level: Not on file  Occupational History   Not on file  Tobacco Use   Smoking status: Never   Smokeless tobacco: Never  Substance and Sexual Activity   Alcohol use: Not on file   Drug use: Not on file   Sexual activity: Not on file  Other Topics Concern   Not on file  Social History Narrative   Not on file   Social Determinants of Health   Financial Resource Strain: Not on file  Food Insecurity: Not on file   Transportation Needs: Not on file  Physical Activity: Not on file  Stress: Not on file  Social Connections: Not on file  Intimate Partner Violence: Not on file    History reviewed. No pertinent family history.   Review of Systems  Constitutional: Negative.  Negative for chills and fever.  HENT: Negative.  Negative for congestion and sore throat.   Eyes: Negative.   Respiratory: Negative.  Negative for cough, hemoptysis and shortness of breath.   Cardiovascular: Negative.  Negative for chest pain and palpitations.  Gastrointestinal: Negative.  Negative for abdominal pain, blood in stool, diarrhea, melena, nausea and vomiting.  Genitourinary: Negative.  Negative for dysuria and hematuria.  Musculoskeletal: Negative.  Negative for myalgias and neck pain.  Skin: Negative.  Negative for rash.  Neurological: Negative.  Negative for dizziness and headaches.  All other systems reviewed and are negative.  Vitals:   07/07/21 1452  BP: 126/80  Pulse: 85  SpO2: 96%   Wt Readings from Last 3 Encounters:  07/07/21 185 lb (83.9 kg)  05/24/20 181 lb (82.1 kg)  08/26/19 184 lb (83.5 kg)    Physical Exam Vitals reviewed.  Constitutional:      Appearance: Normal appearance.  HENT:     Head: Normocephalic and atraumatic.     Right Ear: Tympanic membrane, ear canal and external ear normal.     Left Ear: Tympanic membrane, ear canal  and external ear normal.     Mouth/Throat:     Mouth: Mucous membranes are moist.     Pharynx: Oropharynx is clear.  Eyes:     Extraocular Movements: Extraocular movements intact.     Conjunctiva/sclera: Conjunctivae normal.     Pupils: Pupils are equal, round, and reactive to light.  Cardiovascular:     Rate and Rhythm: Normal rate and regular rhythm.     Pulses: Normal pulses.     Heart sounds: Normal heart sounds.  Pulmonary:     Effort: Pulmonary effort is normal.     Breath sounds: Normal breath sounds.  Abdominal:     General: Bowel sounds are  normal. There is no distension.     Palpations: Abdomen is soft.  Musculoskeletal:        General: Normal range of motion.     Cervical back: Normal range of motion and neck supple.     Right lower leg: No edema.     Left lower leg: No edema.  Skin:    General: Skin is warm and dry.     Capillary Refill: Capillary refill takes less than 2 seconds.  Neurological:     General: No focal deficit present.     Mental Status: He is alert and oriented to person, place, and time.  Psychiatric:        Mood and Affect: Mood normal.        Behavior: Behavior normal.   EKG: Sinus bradycardia with a ventricular rate of 57.  No acute ischemic changes.  Normal EKG.  ASSESSMENT & PLAN: Problem List Items Addressed This Visit       Cardiovascular and Mediastinum   Essential hypertension - Primary   Relevant Orders   Comprehensive metabolic panel   EKG [id number]     Other   Dyslipidemia   Relevant Orders   Lipid panel   Other Visit Diagnoses     Need for Tdap vaccination       Relevant Orders   Tdap vaccine greater than or equal to 7yo IM (Completed)   History of kidney surgery       Relevant Orders   US Renal   Routine general medical examination at a health care facility       Screening for deficiency anemia       Relevant Orders   CBC with Differential/Platelet   Screening for lipoid disorders       Screening for endocrine, metabolic and immunity disorder       Relevant Orders   Hemoglobin A1c   Screen for STD (sexually transmitted disease)       Relevant Orders   HSV(herpes simplex vrs) 1+2 ab-IgG   RPR   HIV Antibody (routine testing w rflx)   Hepatitis C antibody   Unknown status of immunity to COVID-19 virus       Relevant Orders   SAR CoV2 Serology (COVID 19)AB(IGG)IA      Modifiable risk factors discussed with patient. Anticipatory guidance according to age provided. The following topics were also discussed: Social Determinants of Health Smoking non-smoker. Diet  and nutrition Benefits of exercise Cancer family history review Vaccinations recommendations Cardiovascular risk assessment Hypertension management Mental health including depression and anxiety Fall and accident prevention  Patient Instructions  Mantenimiento de Technical sales engineer en [place] Maintenance, Male Adoptar un estilo de vida saludable y recibir atencin preventiva son importantes para promover la salud y Musician. Consulte al mdico sobre: El esquema adecuado  para hacerse pruebas y exmenes peridicos. Cosas que puede hacer por su cuenta para prevenir enfermedades y [place] sano. Qu debo saber sobre la dieta, el peso y el ejercicio? Consuma una dieta saludable  Consuma una dieta que incluya muchas verduras, frutas, productos lcteos con bajo contenido de [place] y Advertising account planner. No consuma muchos alimentos ricos en grasas slidas, azcares agregados o sodio. Mantenga un peso saludable El ndice de masa muscular [facility]) es una medida que puede utilizarse para identificar posibles problemas de [place]. Proporciona una estimacin de la grasa corporal basndose en el peso y la altura. Su mdico puede ayudarle a Radiation protection practitioner [place] y a Scientist, forensic o Theatre manager un peso saludable. Haga ejercicio con regularidad Haga ejercicio con regularidad. Esta es una de las prcticas ms importantes que puede hacer por su salud. La [employer] de los adultos deben seguir estas pautas: Optometrist, al menos, 150 minutos de actividad fsica por semana. El ejercicio debe aumentar la frecuencia cardaca y Nature conservation officer transpirar (ejercicio de intensidad moderada). Hacer ejercicios de fortalecimiento por lo [employer] por semana. Agregue esto a su plan de ejercicio de intensidad moderada. Pase menos tiempo sentado. Incluso la actividad fsica ligera puede ser beneficiosa. Controle sus niveles de colesterol y lpidos en la sangre Comience a realizarse anlisis de lpidos y Research officer, trade union en la sangre a los 36 aos y  luego reptalos cada 5 aos. Es posible que Automotive engineer los niveles de colesterol con mayor frecuencia si: Sus niveles de lpidos y colesterol son altos. Es mayor de 74 aos. Presenta un alto riesgo de padecer enfermedades cardacas. Qu debo saber sobre las pruebas de deteccin del cncer? Muchos tipos de cncer pueden detectarse de manera temprana y, a menudo, pueden prevenirse. Segn su historia clnica y sus antecedentes familiares, es posible que deba realizarse pruebas de deteccin del cncer en diferentes edades. Esto puede incluir pruebas de deteccin de lo siguiente: Surveyor, minerals. Cncer de prstata. Cncer de piel. Cncer de pulmn. Qu debo saber sobre la enfermedad cardaca, la diabetes y la hipertensin arterial? Presin arterial y enfermedad cardaca La hipertensin arterial causa enfermedades cardacas y [place] el riesgo de accidente cerebrovascular. Es ms probable que esto se manifieste en las personas que tienen lecturas de presin arterial alta o tienen sobrepeso. Hable con el mdico sobre sus valores de presin arterial deseados. Hgase controlar la presin arterial: Cada 3 a 5 aos si tiene entre 18 y 58 aos. Todos los aos si es mayor de 40 aos. Si tiene entre 44 y 66 aos y es fumador o Insurance account manager, pregntele al mdico si debe realizarse una prueba de deteccin de aneurisma artico abdominal (AAA) por nica vez. Diabetes Realcese exmenes de deteccin de la diabetes con regularidad. Este anlisis revisa el nivel de azcar en la sangre en [place]. Hgase las pruebas de deteccin: Cada tres aos despus de los 94 aos de edad si tiene un peso normal y un bajo riesgo de padecer diabetes. Con ms frecuencia y a partir de [place] edad inferior si tiene sobrepeso o un alto riesgo de padecer diabetes. Qu debo saber sobre la prevencin de infecciones? Hepatitis B Si tiene un riesgo ms alto de contraer hepatitis B, debe someterse a un examen de deteccin de  este virus. Hable con el mdico para averiguar si tiene riesgo de contraer la infeccin por hepatitis B. Hepatitis C Se recomienda un anlisis de [place] para: Todos los que nacieron entre 1944 y [phone]. Todas las personas que tengan un riesgo de haber contrado hepatitis C.  Enfermedades de transmisin sexual (ETS) Debe realizarse pruebas de deteccin de ITS todos los aos, incluidas la gonorrea y la clamidia, si: Es sexualmente activo y es menor de 35 aos. Es mayor de 63 aos, y Investment banker, operational informa que corre riesgo de tener este tipo de infecciones. La actividad sexual ha cambiado desde que le hicieron la ltima prueba de deteccin y tiene un riesgo mayor de Best boy clamidia o Radio broadcast assistant. Pregntele al mdico si usted tiene riesgo. Pregntele al mdico si usted tiene un alto riesgo de Museum/gallery curator VIH. El mdico tambin puede recomendarle un medicamento recetado para ayudar a evitar la infeccin por el VIH. Si elige tomar medicamentos para prevenir el VIH, primero debe [employer] de deteccin del VIH. Luego debe hacerse anlisis cada 3 meses mientras est tomando los medicamentos. Siga estas indicaciones en su casa: Consumo de alcohol No beba alcohol si el mdico se lo prohbe. Si bebe alcohol: Limite la cantidad que consume de 0 a 2 bebidas por da. Sepa cunta cantidad de alcohol hay en las bebidas que toma. En los [place], una medida equivale a una botella de cerveza de 12 oz (355 ml), un vaso de vino de 5 oz (148 ml) o un vaso de una bebida alcohlica de alta graduacin de 1 oz (44 ml). Estilo de vida No consuma ningn producto que contenga nicotina o tabaco. Estos productos incluyen cigarrillos, tabaco para Higher education careers adviser y aparatos de vapeo, como los Psychologist, sport and exercise. Si necesita ayuda para dejar de consumir estos productos, consulte al mdico. No consuma drogas. No comparta agujas. Solicite ayuda a su mdico si necesita apoyo o informacin para abandonar las  drogas. Indicaciones generales Realcese los estudios de rutina de la salud, dentales y de Public librarian. [place]. Infrmele a su mdico si: Se siente deprimido con frecuencia. Alguna vez ha sido vctima de [place] o no se siente seguro en su casa. Resumen Adoptar un estilo de vida saludable y recibir atencin preventiva son importantes para promover la salud y Musician. Siga las instrucciones del mdico acerca de una dieta saludable, el ejercicio y la realizacin de pruebas o exmenes para Engineer, building services. Siga las instrucciones del mdico con respecto al control del colesterol y la presin arterial. Esta informacin no tiene Marine scientist el consejo del mdico. Asegrese de hacerle al mdico cualquier pregunta que tenga. Document Revised: 11/03/2020 Document Reviewed: 11/03/2020 Elsevier Patient Education  2022 [place], MD [facility]

## 2021-07-08 ENCOUNTER — Encounter: Payer: Self-pay | Admitting: Emergency Medicine

## 2021-07-08 LAB — HEMOGLOBIN A1C: Hgb A1c MFr Bld: 5.8 % (ref 4.6–6.5)

## 2021-07-08 LAB — HIV ANTIBODY (ROUTINE TESTING W REFLEX): HIV 1&2 Ab, 4th Generation: NONREACTIVE

## 2021-07-08 LAB — HEPATITIS C ANTIBODY
Hepatitis C Ab: NONREACTIVE
SIGNAL TO CUT-OFF: 0.11 (ref <1.00)

## 2021-07-08 LAB — SAR COV2 SEROLOGY (COVID19)AB(IGG),IA
SARS-CoV-2 Semi-Quant IgG Ab: >800 AU/mL (ref <13.0)
SARS-CoV-2 Spike Ab Interp: POSITIVE

## 2021-07-08 LAB — HSV(HERPES SIMPLEX VRS) I + II AB-IGG
HAV 1 IGG,TYPE SPECIFIC AB: >58 index — ABNORMAL HIGH
HSV 2 IGG,TYPE SPECIFIC AB: <0.9 index

## 2021-07-08 LAB — RPR: RPR Ser Ql: NONREACTIVE

## 2021-07-09 ENCOUNTER — Encounter: Payer: Self-pay | Admitting: Emergency Medicine

## 2021-07-10 ENCOUNTER — Other Ambulatory Visit: Payer: Self-pay | Admitting: Emergency Medicine

## 2021-07-10 DIAGNOSIS — N289 Disorder of kidney and ureter, unspecified: Secondary | ICD-10-CM

## 2021-07-13 ENCOUNTER — Ambulatory Visit
Admission: RE | Admit: 2021-07-13 | Discharge: 2021-07-13 | Disposition: A | Discharge status: Home / Self Care | Payer: 59 | Source: Ambulatory Visit | Attending: Emergency Medicine | Admitting: Emergency Medicine

## 2021-07-13 DIAGNOSIS — N281 Cyst of kidney, acquired: Secondary | ICD-10-CM | POA: Diagnosis not present

## 2021-07-13 DIAGNOSIS — Z9889 Other specified postprocedural states: Secondary | ICD-10-CM | POA: Diagnosis not present

## 2021-07-13 NOTE — Telephone Encounter (Signed)
This message was responded to on 07/10/2021.

## 2021-07-14 ENCOUNTER — Encounter: Payer: Self-pay | Admitting: Emergency Medicine

## 2021-09-22 DIAGNOSIS — N1831 Chronic kidney disease, stage 3a: Secondary | ICD-10-CM | POA: Diagnosis not present

## 2021-09-22 DIAGNOSIS — I129 Hypertensive chronic kidney disease with stage 1 through stage 4 chronic kidney disease, or unspecified chronic kidney disease: Secondary | ICD-10-CM | POA: Diagnosis not present

## 2021-09-22 DIAGNOSIS — N182 Chronic kidney disease, stage 2 (mild): Secondary | ICD-10-CM | POA: Diagnosis not present

## 2021-09-22 DIAGNOSIS — Q6212 Congenital occlusion of ureterovesical orifice: Secondary | ICD-10-CM | POA: Diagnosis not present

## 2021-09-26 ENCOUNTER — Telehealth: Payer: Self-pay | Admitting: Emergency Medicine

## 2021-09-26 DIAGNOSIS — M7989 Other specified soft tissue disorders: Secondary | ICD-10-CM | POA: Diagnosis not present

## 2021-09-26 DIAGNOSIS — S93401A Sprain of unspecified ligament of right ankle, initial encounter: Secondary | ICD-10-CM | POA: Diagnosis not present

## 2021-09-26 DIAGNOSIS — S99911A Unspecified injury of right ankle, initial encounter: Secondary | ICD-10-CM | POA: Diagnosis not present

## 2021-09-26 DIAGNOSIS — M25571 Pain in right ankle and joints of right foot: Secondary | ICD-10-CM | POA: Diagnosis not present

## 2021-09-26 NOTE — Telephone Encounter (Signed)
Called patient to inform him of provider recommendation. Patient verbalize understanding.  ?

## 2021-09-26 NOTE — Telephone Encounter (Signed)
Pt called in demanding to schedule ov w/ provider for today 4-17 due to what pt states was a broken ankle due to him playing soccer on 4-16 and needing an xray ? ?Advised pt provider did not have appt for today ? ?Pt then requested an xray, advised pt an ov is needed ? ?Advised pt if ankle is broken to visit ed ? ?Pt became agitated and stated "what is the ed going to do" ? ?Offered pt an appt for tomorrow w/ another [facility] provider, pt declined an asked to speak to a supervisor, informed pt supervisor was unavailable and offered a cb,  ? ?pt again became agitated and stated his ankle was broken and needed to be seen today, pt stated "Dr. Alvy Bimler is my friend and if you tell him the situation he will see me today." ? ?I then explained the situation to the cma and call was transferred  ?

## 2021-09-26 NOTE — Telephone Encounter (Signed)
Agree with recommendation.  Needs to be seen at urgent care center.  Thanks.

## 2021-09-26 NOTE — Telephone Encounter (Signed)
Patient was transferred to me by scheduler. Patient stated he may of broke his ankle and wanted to have an appt with Dr. Alvy Bimler. I made patient aware that we didn't have any opening with Dr. Alvy Bimler. I mentioned to him that if he thinks he may of broken his ankle that he needed to be seen at the ER of [facility] for x ray as well as an evaluation with a provider. Patient was not happy. Patient insisted that he be added one to Dr.Sagardia schedule . I made patient aware that we had no opening with him and since he didn't want to see another provider that he would needed to be seen at the ER and [facility]. Patient stated that he will go to [facility] and that he will send provider a message.  ? ? ?

## 2021-09-27 DIAGNOSIS — S93491A Sprain of other ligament of right ankle, initial encounter: Secondary | ICD-10-CM | POA: Diagnosis not present

## 2021-10-19 ENCOUNTER — Telehealth: Payer: Self-pay

## 2021-10-19 NOTE — Telephone Encounter (Signed)
Called patient and informed him that his rx was in back in 05/10/21 for year supply. Patient needs to call pharmacy to request another refill. Patient verbalize understanding  ?

## 2021-10-19 NOTE — Telephone Encounter (Signed)
Pt is requesting a refill on: ?losartan (COZAAR) 50 MG tablet ? ?Pharmacy: ?[facility], [place] ? ?LOV 07/07/21 ?

## 2021-11-15 ENCOUNTER — Other Ambulatory Visit: Payer: Self-pay | Admitting: Nephrology

## 2021-11-15 DIAGNOSIS — N182 Chronic kidney disease, stage 2 (mild): Secondary | ICD-10-CM

## 2021-11-25 ENCOUNTER — Ambulatory Visit
Admission: RE | Admit: 2021-11-25 | Discharge: 2021-11-25 | Disposition: A | Discharge status: Home / Self Care | Payer: 59 | Source: Ambulatory Visit | Attending: Nephrology | Admitting: Nephrology

## 2021-11-25 DIAGNOSIS — N182 Chronic kidney disease, stage 2 (mild): Secondary | ICD-10-CM

## 2021-11-25 DIAGNOSIS — N1330 Unspecified hydronephrosis: Secondary | ICD-10-CM | POA: Diagnosis not present

## 2021-11-25 DIAGNOSIS — R109 Unspecified abdominal pain: Secondary | ICD-10-CM | POA: Diagnosis not present

## 2022-02-10 DIAGNOSIS — N130 Hydronephrosis with ureteropelvic junction obstruction: Secondary | ICD-10-CM | POA: Diagnosis not present

## 2022-02-10 DIAGNOSIS — N200 Calculus of kidney: Secondary | ICD-10-CM | POA: Diagnosis not present

## 2022-03-02 DIAGNOSIS — N200 Calculus of kidney: Secondary | ICD-10-CM | POA: Diagnosis not present

## 2022-03-08 DIAGNOSIS — H6121 Impacted cerumen, right ear: Secondary | ICD-10-CM | POA: Diagnosis not present

## 2022-03-08 DIAGNOSIS — H6122 Impacted cerumen, left ear: Secondary | ICD-10-CM | POA: Diagnosis not present

## 2022-05-20 ENCOUNTER — Other Ambulatory Visit: Payer: Self-pay | Admitting: Emergency Medicine

## 2022-05-20 DIAGNOSIS — I10 Essential (primary) hypertension: Secondary | ICD-10-CM

## 2022-09-27 ENCOUNTER — Encounter: Payer: 59 | Admitting: Emergency Medicine

## 2022-10-10 ENCOUNTER — Encounter: Payer: 59 | Admitting: Emergency Medicine

## 2022-10-17 ENCOUNTER — Encounter: Payer: 59 | Admitting: Emergency Medicine

## 2022-11-01 ENCOUNTER — Encounter: Payer: Self-pay | Admitting: Emergency Medicine

## 2022-11-01 ENCOUNTER — Telehealth: Payer: Self-pay | Admitting: Emergency Medicine

## 2022-11-01 ENCOUNTER — Ambulatory Visit (INDEPENDENT_AMBULATORY_CARE_PROVIDER_SITE_OTHER): Payer: 59 | Admitting: Emergency Medicine

## 2022-11-01 VITALS — BP 128/80 | HR 60 | Temp 98.6°F | Ht 66.0 in | Wt 188.1 lb

## 2022-11-01 DIAGNOSIS — Z13228 Encounter for screening for other metabolic disorders: Secondary | ICD-10-CM

## 2022-11-01 DIAGNOSIS — N1831 Chronic kidney disease, stage 3a: Secondary | ICD-10-CM | POA: Diagnosis not present

## 2022-11-01 DIAGNOSIS — Z1329 Encounter for screening for other suspected endocrine disorder: Secondary | ICD-10-CM | POA: Diagnosis not present

## 2022-11-01 DIAGNOSIS — Z125 Encounter for screening for malignant neoplasm of prostate: Secondary | ICD-10-CM | POA: Diagnosis not present

## 2022-11-01 DIAGNOSIS — E785 Hyperlipidemia, unspecified: Secondary | ICD-10-CM

## 2022-11-01 DIAGNOSIS — Z130 Encounter for screening for diseases of the blood and blood-forming organs and certain disorders involving the immune mechanism: Secondary | ICD-10-CM | POA: Diagnosis not present

## 2022-11-01 DIAGNOSIS — Z0000 Encounter for general adult medical examination without abnormal findings: Secondary | ICD-10-CM

## 2022-11-01 DIAGNOSIS — Z13220 Encounter for screening for lipoid disorders: Secondary | ICD-10-CM

## 2022-11-01 DIAGNOSIS — I10 Essential (primary) hypertension: Secondary | ICD-10-CM

## 2022-11-01 NOTE — Telephone Encounter (Signed)
Pt came in today early at 3:30 for a 4:00 physical, was checked in and then he informed me that he was going downstairs with his daughter for her sports medicine appointment.   Pt was informed that he had to be back upstairs for his 4:00 appointment no later than 4:10 and his 'check in' status was cancelled per providers orders.

## 2022-11-01 NOTE — Assessment & Plan Note (Signed)
Advised to stay well-hydrated and avoid NSAIDs Recommend nephrology evaluation Referral placed today.

## 2022-11-01 NOTE — Assessment & Plan Note (Signed)
Diet and nutrition discussed Lipid profile done today The 10-year ASCVD risk score (Arnett DK, et al., 2019) is: 1.6%   Values used to calculate the score:     Age: 47 years     Sex: Male     Is Non-Hispanic African American: No     Diabetic: No     Tobacco smoker: No     Systolic Blood Pressure: 128 mmHg     Is BP treated: Yes     HDL Cholesterol: 62 mg/dL     Total Cholesterol: 188 mg/dL

## 2022-11-01 NOTE — Progress Notes (Signed)
Kyle Hancock 47 y.o.   Chief Complaint  Patient presents with   Annual Exam    Patient states that he seen the nephrologist and was not comfortable with the conversation about poss kidney surgery   Patient wants a referral to another nephrologist     Hypertension    Patient BP is still being elevated, still having headache    HISTORY OF PRESENT ILLNESS: This is a 47 y.o. male here for annual exam.  History of CKD History of kidney stones Has been evaluated by urologist Requesting nephrology referral States blood pressure readings at home elevated with occasional headaches No other complaints or medical concerns today.   Hypertension Pertinent negatives include no chest pain, headaches, palpitations or shortness of breath.     Prior to Admission medications   Medication Sig Start Date End Date Taking? Authorizing Provider  losartan (COZAAR) 50 MG tablet Take 1 tablet by mouth once daily 05/20/22  Yes Caylon Saine, [place], MD  Cyanocobalamin (VITAMIN B 12 PO) Take by mouth daily. Patient not taking: Reported on 05/24/2020    [provider]  hydrocortisone (ANUSOL-HC) 2.5 % rectal cream Place 1 application rectally 2 (two) times daily. Patient not taking: Reported on 07/07/2021 05/24/20   Georgina Quint, MD    No Known Allergies  Patient Active Problem List   Diagnosis Date Noted   Dyslipidemia 08/26/2019   Dizziness 06/19/2018   Essential hypertension 06/19/2018    No past medical history on file.  No past surgical history on file.  Social History   Socioeconomic History   Marital status: Married    Spouse name: Not on file   Number of children: Not on file   Years of education: Not on file   Highest education level: Not on file  Occupational History   Not on file  Tobacco Use   Smoking status: Never   Smokeless tobacco: Never  Substance and Sexual Activity   Alcohol use: Not on file   Drug use: Not on file   Sexual  activity: Not on file  Other Topics Concern   Not on file  Social History Narrative   Not on file   Social Determinants of Health   Financial Resource Strain: Not on file  Food Insecurity: Not on file  Transportation Needs: Not on file  Physical Activity: Not on file  Stress: Not on file  Social Connections: Not on file  Intimate Partner Violence: Not on file    No family history on file.   Review of Systems  Constitutional: Negative.  Negative for chills and fever.  HENT: Negative.  Negative for congestion and sore throat.   Respiratory: Negative.  Negative for cough and shortness of breath.   Cardiovascular: Negative.  Negative for chest pain and palpitations.  Gastrointestinal:  Negative for abdominal pain, diarrhea, nausea and vomiting.  Genitourinary: Negative.  Negative for dysuria and hematuria.  Skin: Negative.  Negative for rash.  Neurological: Negative.  Negative for dizziness and headaches.  All other systems reviewed and are negative.   Vitals:   11/01/22 1602  BP: 128/80  Pulse: 60  Temp: 98.6 F (37 C)  SpO2: 98%   Wt Readings from Last 3 Encounters:  11/01/22 188 lb 2 oz (85.3 kg)  07/07/21 185 lb (83.9 kg)  05/24/20 181 lb (82.1 kg)     Physical Exam Vitals reviewed.  Constitutional:      Appearance: Normal appearance.  HENT:     Head: Normocephalic.  Right Ear: Tympanic membrane, ear canal and external ear normal.     Left Ear: Tympanic membrane, ear canal and external ear normal.     Mouth/Throat:     Mouth: Mucous membranes are moist.     Pharynx: Oropharynx is clear.  Eyes:     Extraocular Movements: Extraocular movements intact.     Conjunctiva/sclera: Conjunctivae normal.     Pupils: Pupils are equal, round, and reactive to light.  Cardiovascular:     Rate and Rhythm: Normal rate and regular rhythm.     Pulses: Normal pulses.     Heart sounds: Normal heart sounds.  Pulmonary:     Effort: Pulmonary effort is normal.      Breath sounds: Normal breath sounds.  Abdominal:     Palpations: Abdomen is soft.     Tenderness: There is no abdominal tenderness.  Musculoskeletal:     Cervical back: No tenderness.  Lymphadenopathy:     Cervical: No cervical adenopathy.  Skin:    General: Skin is warm and dry.     Capillary Refill: Capillary refill takes less than 2 seconds.  Neurological:     General: No focal deficit present.     Mental Status: He is alert and oriented to person, place, and time.  Psychiatric:        Mood and Affect: Mood normal.        Behavior: Behavior normal.      ASSESSMENT & PLAN: Problem List Items Addressed This Visit       Cardiovascular and Mediastinum   Essential hypertension    Normal blood pressure readings in the office but elevated at home Advised to continue monitoring blood pressure readings at home daily for the next several weeks and keep a log.  If persistently elevated recommend to increase losartan to 100 mg daily. In the meantime continue losartan 50 mg daily      Relevant Orders   Comprehensive metabolic panel     Genitourinary   Stage 3a chronic kidney disease (HCC)    Advised to stay well-hydrated and avoid NSAIDs Recommend nephrology evaluation Referral placed today.      Relevant Orders   Ambulatory referral to Nephrology     Other   Dyslipidemia    Diet and nutrition discussed Lipid profile done today The 10-year ASCVD risk score (Arnett DK, et al., 2019) is: 1.6%   Values used to calculate the score:     Age: 69 years     Sex: Male     Is Non-Hispanic African American: No     Diabetic: No     Tobacco smoker: No     Systolic Blood Pressure: 128 mmHg     Is BP treated: Yes     HDL Cholesterol: 62 mg/dL     Total Cholesterol: 188 mg/dL       Relevant Orders   Lipid panel   Other Visit Diagnoses     Routine general medical examination at a health care facility    -  Primary   Relevant Orders   CBC with Differential   Comprehensive  metabolic panel   Hemoglobin A1c   Lipid panel   PSA(Must document that pt has been informed of limitations of PSA testing.)   Screening for deficiency anemia       Relevant Orders   CBC with Differential   Screening for lipoid disorders       Relevant Orders   Lipid panel   Screening for endocrine, metabolic  and immunity disorder       Relevant Orders   Comprehensive metabolic panel   Hemoglobin A1c   Prostate cancer screening       Relevant Orders   PSA(Must document that pt has been informed of limitations of PSA testing.)      Modifiable risk factors discussed with patient. Anticipatory guidance according to age provided. The following topics were also discussed: Social Determinants of Health Smoking.  Non-smoker Diet and nutrition Benefits of exercise Cancer family history review Vaccinations reviewed and recommendations Cardiovascular risk assessment The 10-year ASCVD risk score (Arnett DK, et al., 2019) is: 1.6%   Values used to calculate the score:     Age: 50 years     Sex: Male     Is Non-Hispanic African American: No     Diabetic: No     Tobacco smoker: No     Systolic Blood Pressure: 128 mmHg     Is BP treated: Yes     HDL Cholesterol: 62 mg/dL     Total Cholesterol: 188 mg/dL Hypertension diagnosis and management Mental health including depression and anxiety Fall and accident prevention  Patient Instructions  Health Maintenance, Male Adopting a healthy lifestyle and getting preventive care are important in promoting health and wellness. Ask your health care provider about: The right schedule for you to have regular tests and exams. Things you can do on your own to prevent diseases and keep yourself healthy. What should I know about diet, weight, and exercise? Eat a healthy diet  Eat a diet that includes plenty of vegetables, fruits, low-fat dairy products, and lean protein. Do not eat a lot of foods that are high in solid fats, added sugars, or  sodium. Maintain a healthy weight Body mass index (BMI) is a measurement that can be used to identify possible weight problems. It estimates body fat based on height and weight. Your health care provider can help determine your BMI and help you achieve or maintain a healthy weight. Get regular exercise Get regular exercise. This is one of the most important things you can do for your health. Most adults should: Exercise for at least 150 minutes each week. The exercise should increase your heart rate and make you sweat (moderate-intensity exercise). Do strengthening exercises at least twice a week. This is in addition to the moderate-intensity exercise. Spend less time sitting. Even light physical activity can be beneficial. Watch cholesterol and blood lipids Have your blood tested for lipids and cholesterol at 46 years of age, then have this test every 5 years. You may need to have your cholesterol levels checked more often if: Your lipid or cholesterol levels are high. You are older than 46 years of age. You are at high risk for heart disease. What should I know about cancer screening? Many types of cancers can be detected early and may often be prevented. Depending on your health history and family history, you may need to have cancer screening at various ages. This may include screening for: Colorectal cancer. Prostate cancer. Skin cancer. Lung cancer. What should I know about heart disease, diabetes, and high blood pressure? Blood pressure and heart disease High blood pressure causes heart disease and increases the risk of stroke. This is more likely to develop in people who have high blood pressure readings or are overweight. Talk with your health care provider about your target blood pressure readings. Have your blood pressure checked: Every 3-5 years if you are 41-74 years of age. Every year  if you are 11 years old or older. If you are between the ages of 61 and 52 and are a current  or former smoker, ask your health care provider if you should have a one-time screening for abdominal aortic aneurysm (AAA). Diabetes Have regular diabetes screenings. This checks your fasting blood sugar level. Have the screening done: Once every three years after age 3 if you are at a normal weight and have a low risk for diabetes. More often and at a younger age if you are overweight or have a high risk for diabetes. What should I know about preventing infection? Hepatitis B If you have a higher risk for hepatitis B, you should be screened for this virus. Talk with your health care provider to find out if you are at risk for hepatitis B infection. Hepatitis C Blood testing is recommended for: Everyone born from [birth year] through 1965. Anyone with known risk factors for hepatitis C. Sexually transmitted infections (STIs) You should be screened each year for STIs, including gonorrhea and chlamydia, if: You are sexually active and are younger than 46 years of age. You are older than 46 years of age and your health care provider tells you that you are at risk for this type of infection. Your sexual activity has changed since you were last screened, and you are at increased risk for chlamydia or gonorrhea. Ask your health care provider if you are at risk. Ask your health care provider about whether you are at high risk for HIV. Your health care provider may recommend a prescription medicine to help prevent HIV infection. If you choose to take medicine to prevent HIV, you should first get tested for HIV. You should then be tested every 3 months for as long as you are taking the medicine. Follow these instructions at home: Alcohol use Do not drink alcohol if your health care provider tells you not to drink. If you drink alcohol: Limit how much you have to 0-2 drinks a day. Know how much alcohol is in your drink. In the [place] one drink equals one 12 oz bottle of beer (355 mL), one 5 oz glass of wine  (148 mL), or one 1 oz glass of hard liquor (44 mL). Lifestyle Do not use any products that contain nicotine or tobacco. These products include cigarettes, chewing tobacco, and vaping devices, such as e-cigarettes. If you need help quitting, ask your health care provider. Do not use street drugs. Do not share needles. Ask your health care provider for help if you need support or information about quitting drugs. General instructions Schedule regular health, dental, and eye exams. Stay current with your vaccines. Tell your health care provider if: You often feel depressed. You have ever been abused or do not feel safe at home. Summary Adopting a healthy lifestyle and getting preventive care are important in promoting health and wellness. Follow your health care provider's instructions about healthy diet, exercising, and getting tested or screened for diseases. Follow your health care provider's instructions on monitoring your cholesterol and blood pressure. This information is not intended to replace advice given to you by your health care provider. Make sure you discuss any questions you have with your health care provider. Document Revised: 10/18/2020 Document Reviewed: 10/18/2020 Elsevier Patient Education  2023 Elsevier Inc.       Edwina Barth, MD [facility]

## 2022-11-01 NOTE — Assessment & Plan Note (Signed)
Normal blood pressure readings in the office but elevated at home Advised to continue monitoring blood pressure readings at home daily for the next several weeks and keep a log.  If persistently elevated recommend to increase losartan to 100 mg daily. In the meantime continue losartan 50 mg daily

## 2022-11-01 NOTE — Patient Instructions (Signed)
Health Maintenance, Male Adopting a healthy lifestyle and getting preventive care are important in promoting health and wellness. Ask your health care provider about: The right schedule for you to have regular tests and exams. Things you can do on your own to prevent diseases and keep yourself healthy. What should I know about diet, weight, and exercise? Eat a healthy diet  Eat a diet that includes plenty of vegetables, fruits, low-fat dairy products, and lean protein. Do not eat a lot of foods that are high in solid fats, added sugars, or sodium. Maintain a healthy weight Body mass index (BMI) is a measurement that can be used to identify possible weight problems. It estimates body fat based on height and weight. Your health care provider can help determine your BMI and help you achieve or maintain a healthy weight. Get regular exercise Get regular exercise. This is one of the most important things you can do for your health. Most adults should: Exercise for at least 150 minutes each week. The exercise should increase your heart rate and make you sweat (moderate-intensity exercise). Do strengthening exercises at least twice a week. This is in addition to the moderate-intensity exercise. Spend less time sitting. Even light physical activity can be beneficial. Watch cholesterol and blood lipids Have your blood tested for lipids and cholesterol at 46 years of age, then have this test every 5 years. You may need to have your cholesterol levels checked more often if: Your lipid or cholesterol levels are high. You are older than 46 years of age. You are at high risk for heart disease. What should I know about cancer screening? Many types of cancers can be detected early and may often be prevented. Depending on your health history and family history, you may need to have cancer screening at various ages. This may include screening for: Colorectal cancer. Prostate cancer. Skin cancer. Lung  cancer. What should I know about heart disease, diabetes, and high blood pressure? Blood pressure and heart disease High blood pressure causes heart disease and increases the risk of stroke. This is more likely to develop in people who have high blood pressure readings or are overweight. Talk with your health care provider about your target blood pressure readings. Have your blood pressure checked: Every 3-5 years if you are 18-39 years of age. Every year if you are 40 years old or older. If you are between the ages of 65 and 75 and are a current or former smoker, ask your health care provider if you should have a one-time screening for abdominal aortic aneurysm (AAA). Diabetes Have regular diabetes screenings. This checks your fasting blood sugar level. Have the screening done: Once every three years after age 45 if you are at a normal weight and have a low risk for diabetes. More often and at a younger age if you are overweight or have a high risk for diabetes. What should I know about preventing infection? Hepatitis B If you have a higher risk for hepatitis B, you should be screened for this virus. Talk with your health care provider to find out if you are at risk for hepatitis B infection. Hepatitis C Blood testing is recommended for: Everyone born from 1945 through 1965. Anyone with known risk factors for hepatitis C. Sexually transmitted infections (STIs) You should be screened each year for STIs, including gonorrhea and chlamydia, if: You are sexually active and are younger than 46 years of age. You are older than 46 years of age and your   health care provider tells you that you are at risk for this type of infection. Your sexual activity has changed since you were last screened, and you are at increased risk for chlamydia or gonorrhea. Ask your health care provider if you are at risk. Ask your health care provider about whether you are at high risk for HIV. Your health care provider  may recommend a prescription medicine to help prevent HIV infection. If you choose to take medicine to prevent HIV, you should first get tested for HIV. You should then be tested every 3 months for as long as you are taking the medicine. Follow these instructions at home: Alcohol use Do not drink alcohol if your health care provider tells you not to drink. If you drink alcohol: Limit how much you have to 0-2 drinks a day. Know how much alcohol is in your drink. In the [place] one drink equals one 12 oz bottle of beer (355 mL), one 5 oz glass of wine (148 mL), or one 1 oz glass of hard liquor (44 mL). Lifestyle Do not use any products that contain nicotine or tobacco. These products include cigarettes, chewing tobacco, and vaping devices, such as e-cigarettes. If you need help quitting, ask your health care provider. Do not use street drugs. Do not share needles. Ask your health care provider for help if you need support or information about quitting drugs. General instructions Schedule regular health, dental, and eye exams. Stay current with your vaccines. Tell your health care provider if: You often feel depressed. You have ever been abused or do not feel safe at home. Summary Adopting a healthy lifestyle and getting preventive care are important in promoting health and wellness. Follow your health care provider's instructions about healthy diet, exercising, and getting tested or screened for diseases. Follow your health care provider's instructions on monitoring your cholesterol and blood pressure. This information is not intended to replace advice given to you by your health care provider. Make sure you discuss any questions you have with your health care provider. Document Revised: 10/18/2020 Document Reviewed: 10/18/2020 Elsevier Patient Education  2023 [employer]  

## 2022-11-02 LAB — COMPREHENSIVE METABOLIC PANEL
ALT: 37 U/L (ref 0–53)
AST: 25 U/L (ref 0–37)
Albumin: 4.5 g/dL (ref 3.5–5.2)
Alkaline Phosphatase: 75 U/L (ref 39–117)
BUN: 18 mg/dL (ref 6–23)
CO2: 28 mEq/L (ref 19–32)
Calcium: 10.1 mg/dL (ref 8.4–10.5)
Chloride: 101 mEq/L (ref 96–112)
Creatinine, Ser: 1.09 mg/dL (ref 0.40–1.50)
GFR: 81.86 mL/min (ref >60.00)
Glucose, Bld: 95 mg/dL (ref 70–99)
Potassium: 4.1 mEq/L (ref 3.5–5.1)
Sodium: 136 mEq/L (ref 135–145)
Total Bilirubin: 0.4 mg/dL (ref 0.2–1.2)
Total Protein: 7.2 g/dL (ref 6.0–8.3)

## 2022-11-02 LAB — LIPID PANEL
Cholesterol: 190 mg/dL (ref 0–200)
HDL: 61.6 mg/dL (ref >39.00)
Total CHOL/HDL Ratio: 3
Triglycerides: 404 mg/dL — ABNORMAL HIGH (ref 0.0–149.0)

## 2022-11-02 LAB — CBC WITH DIFFERENTIAL/PLATELET
Basophils Absolute: 0.1 10*3/uL (ref 0.0–0.1)
Basophils Relative: 1.1 % (ref 0.0–3.0)
Eosinophils Absolute: 0.2 10*3/uL (ref 0.0–0.7)
Eosinophils Relative: 2.8 % (ref 0.0–5.0)
HCT: 42.8 % (ref 39.0–52.0)
Hemoglobin: 14.1 g/dL (ref 13.0–17.0)
Lymphocytes Relative: 39 % (ref 12.0–46.0)
Lymphs Abs: 2.3 10*3/uL (ref 0.7–4.0)
MCHC: 33.1 g/dL (ref 30.0–36.0)
MCV: 79.1 fl (ref 78.0–100.0)
Monocytes Absolute: 0.4 10*3/uL (ref 0.1–1.0)
Monocytes Relative: 7.5 % (ref 3.0–12.0)
Neutro Abs: 3 10*3/uL (ref 1.4–7.7)
Neutrophils Relative %: 49.6 % (ref 43.0–77.0)
Platelets: 252 10*3/uL (ref 150.0–400.0)
RBC: 5.41 Mil/uL (ref 4.22–5.81)
RDW: 13.5 % (ref 11.5–15.5)
WBC: 6 10*3/uL (ref 4.0–10.5)

## 2022-11-02 LAB — LDL CHOLESTEROL, DIRECT: Direct LDL: 104 mg/dL

## 2022-11-02 LAB — PSA: PSA: 0.44 ng/mL (ref 0.10–4.00)

## 2022-11-02 LAB — HEMOGLOBIN A1C: Hgb A1c MFr Bld: 6 % (ref 4.6–6.5)

## 2022-11-10 ENCOUNTER — Encounter: Payer: Self-pay | Admitting: Emergency Medicine

## 2022-11-10 NOTE — Telephone Encounter (Signed)
Please advise patient has questions about his lab results

## 2023-01-18 ENCOUNTER — Ambulatory Visit (INDEPENDENT_AMBULATORY_CARE_PROVIDER_SITE_OTHER): Payer: 59 | Admitting: Emergency Medicine

## 2023-01-18 ENCOUNTER — Encounter: Payer: Self-pay | Admitting: Emergency Medicine

## 2023-01-18 ENCOUNTER — Ambulatory Visit (INDEPENDENT_AMBULATORY_CARE_PROVIDER_SITE_OTHER): Payer: 59

## 2023-01-18 VITALS — BP 130/78 | HR 77 | Temp 99.2°F | Ht 66.0 in | Wt 188.0 lb

## 2023-01-18 DIAGNOSIS — I10 Essential (primary) hypertension: Secondary | ICD-10-CM | POA: Diagnosis not present

## 2023-01-18 DIAGNOSIS — R109 Unspecified abdominal pain: Secondary | ICD-10-CM | POA: Diagnosis not present

## 2023-01-18 DIAGNOSIS — E785 Hyperlipidemia, unspecified: Secondary | ICD-10-CM | POA: Diagnosis not present

## 2023-01-18 DIAGNOSIS — Z87448 Personal history of other diseases of urinary system: Secondary | ICD-10-CM

## 2023-01-18 DIAGNOSIS — M545 Low back pain, unspecified: Secondary | ICD-10-CM | POA: Diagnosis not present

## 2023-01-18 DIAGNOSIS — N1831 Chronic kidney disease, stage 3a: Secondary | ICD-10-CM

## 2023-01-18 MED ORDER — VALSARTAN-HYDROCHLOROTHIAZIDE 160-12.5 MG PO TABS
1.0000 | ORAL_TABLET | Freq: Every day | ORAL | 3 refills | Status: DC
Start: 2023-01-18 — End: 2024-02-05

## 2023-01-18 NOTE — Patient Instructions (Signed)
Hipertensin en los adultos Hypertension, Adult El trmino hipertensin es otra forma de denominar a la presin arterial elevada. La presin arterial elevada fuerza al corazn a trabajar ms para bombear la sangre. Esto puede causar problemas con el paso del tiempo. Una lectura de presin arterial est compuesta por 2 nmeros. Hay un nmero superior (sistlico) sobre un nmero inferior (diastlico). Lo ideal es tener la presin arterial por debajo de 120/80. Cules son las causas? Se desconoce la causa de esta afeccin. Algunas otras afecciones pueden provocar presin arterial elevada. Qu incrementa el riesgo? Algunos factores del estilo de vida pueden hacer que tenga ms probabilidades de desarrollar presin arterial elevada: Fumar. No hacer la cantidad suficiente de actividad fsica o ejercicio. Tener sobrepeso. Consumir mucha grasa, azcar, caloras o sal (sodio) en su dieta. Beber alcohol en exceso. Otros factores de riesgo son los siguientes: Tener alguna de estas afecciones: Enfermedad cardaca. Diabetes. Colesterol alto. Enfermedad renal. Apnea obstructiva del sueo. Tener antecedentes familiares de presin arterial elevada y colesterol elevado. Edad. El riesgo aumenta con la edad. Estrs. Cules son los signos o sntomas? Es posible que la presin arterial alta no cause sntomas. La presin arterial muy alta (crisis hipertensiva) puede provocar: Dolor de cabeza. Latidos cardacos acelerados o irregulares (palpitaciones). Falta de aire. Hemorragia nasal. Vomitar o sentir ganas de vomitar (nuseas). Cambios en la forma de ver. Dolor muy intenso en el pecho. Sensacin de mareo. Convulsiones. Cmo se trata? Esta afeccin se trata haciendo cambios saludables en el estilo de vida, por ejemplo: Consumir alimentos saludables. Hacer ms ejercicio. Beber menos alcohol. El mdico puede recetarle medicamentos si los cambios en el estilo de vida no son lo suficientemente  eficaces y si: El nmero de arriba est por encima de 130. El nmero de abajo est por encima de 80. Su presin arterial personal ideal puede variar. Siga estas indicaciones en su casa: Comida y bebida  Si se lo dicen, siga el plan de alimentacin de DASH (Dietary Approaches to Stop Hypertension, Maneras de alimentarse para detener la hipertensin). Para seguir este plan: Llene la mitad del plato de cada comida con frutas y verduras. Llene un cuarto del plato de cada comida con cereales integrales. Los cereales integrales incluyen pasta integral, arroz integral y pan integral. Coma y beba productos lcteos con bajo contenido de grasa, como leche descremada o yogur bajo en grasas. Llene un cuarto del plato de cada comida con protenas bajas en grasa (magras). Las protenas bajas en grasa incluyen pescado, pollo sin piel, huevos, frijoles y tofu. Evite consumir carne grasa, carne curada y procesada, o pollo con piel. Evite consumir alimentos prehechos o procesados. Limite la cantidad de sal en su dieta a menos de 1500 mg por da. No beba alcohol si: El mdico le indica que no lo haga. Est embarazada, puede estar embarazada o est tratando de quedar embarazada. Si bebe alcohol: Limite la cantidad que bebe a lo siguiente: De 0 a 1 medida por da para las mujeres. De 0 a 2 medidas por da para los hombres. Sepa cunta cantidad de alcohol hay en las bebidas que toma. En los [place], una medida equivale a una botella de cerveza de 12 oz (355 ml), un vaso de vino de 5 oz (148 ml) o un vaso de una bebida alcohlica de alta graduacin de 1 oz (44 ml). Estilo de vida  Trabaje con su mdico para mantenerse en un peso saludable o para perder peso. Pregntele a su mdico cul es el peso recomendable para   usted. Realice al menos 30 minutos de ejercicio que haga que se acelere su corazn (ejercicio aerbico) la mayora de los das de la semana. Estos pueden incluir caminar, nadar o andar en  bicicleta. Realice al menos 30 minutos de ejercicio que fortalezca sus msculos (ejercicios de resistencia) al menos 3 das a la semana. Estos pueden incluir levantar pesas o hacer Pilates. No fume ni consuma ningn producto que contenga nicotina o tabaco. Si necesita ayuda para dejar de consumir estos productos, consulte al mdico. Controle su presin arterial en su casa tal como le indic el mdico. Concurra a todas las visitas de seguimiento. Medicamentos Use los medicamentos de venta libre y los recetados solamente como se lo haya indicado el mdico. Siga cuidadosamente las indicaciones. No omita las dosis de medicamentos para la presin arterial. Los medicamentos pierden eficacia si omite dosis. El hecho de omitir las dosis tambin aumenta el riesgo de otros problemas. Pregntele a su mdico a qu efectos secundarios o reacciones a los medicamentos debe prestar atencin. Comunquese con un mdico si: Piensa que tiene una reaccin a los medicamentos que est tomando. Tiene dolores de cabeza frecuentes. Siente mareos. Tiene hinchazn en los tobillos. Tiene problemas de visin. Solicite ayuda de inmediato si: Siente un dolor de cabeza muy intenso. Empieza a sentirse desorientado (confundido). Se siente dbil o adormecido. Siente que va a desmayarse. Tiene un dolor muy intenso en: Pecho. Vientre (abdomen). Vomita ms de una vez. Tiene dificultad para respirar. Estos sntomas pueden indicar una emergencia. Solicite ayuda de inmediato. Llame al 911. No espere a ver si los sntomas desaparecen. No conduzca por sus propios medios hasta el hospital. Resumen El trmino hipertensin es otra forma de denominar a la presin arterial elevada. La presin arterial elevada fuerza al corazn a trabajar ms para bombear la sangre. Para la mayora de las personas, una presin arterial normal es menor que 120/80. Las decisiones saludables pueden ayudarle a disminuir su presin arterial. Si no puede  bajar su presin arterial mediante decisiones saludables, es posible que deba tomar medicamentos. Esta informacin no tiene como fin reemplazar el consejo del mdico. Asegrese de hacerle al mdico cualquier pregunta que tenga. Document Revised: 04/07/2021 Document Reviewed: 04/07/2021 Elsevier Patient Education  2024 Elsevier Inc.  

## 2023-01-18 NOTE — Assessment & Plan Note (Signed)
Uncontrolled hypertension. Recommend to stop losartan and start valsartan HCTZ 160-12.5 mg daily Cardiovascular risks associated with hypertension discussed Diet and nutrition discussed.

## 2023-01-18 NOTE — Assessment & Plan Note (Signed)
Much improved.  Last GFR over 80.  Back to normal.

## 2023-01-18 NOTE — Assessment & Plan Note (Signed)
Most likely musculoskeletal in nature Pain management discussed Okay to take Tylenol.  Advised against NSAIDs Given kidney history recommend to do right kidney ultrasound

## 2023-01-18 NOTE — Progress Notes (Signed)
Kyle Hancock 47 y.o.   Chief Complaint  Patient presents with   Hypertension    Pt states he's been feeling bad. Pt complains about pain in both kidney in the mornings that last about 10-71mins. also complains of headache he believe its his b/p. Wants to move up in mg for losartan.  wants a referral nephrology    HISTORY OF PRESENT ILLNESS: This is a 47 y.o. male complaining of right-sided lumbar pain worse in the morning and better after about 30 minutes of moving around. Also having trouble with controlling blood pressure.  Taking losartan 50 mg daily and sometimes twice a day Concerned about kidneys.  Was referred to nephrologist last office visit but not scheduled yet. No other complaints or medical concerns today.  HPI   Prior to Admission medications   Medication Sig Start Date End Date Taking? Authorizing Provider  losartan (COZAAR) 50 MG tablet Take 1 tablet by mouth once daily 05/20/22  Yes , [place], MD  Cyanocobalamin (VITAMIN B 12 PO) Take by mouth daily. Patient not taking: Reported on 05/24/2020    [provider]  hydrocortisone (ANUSOL-HC) 2.5 % rectal cream Place 1 application rectally 2 (two) times daily. Patient not taking: Reported on 07/07/2021 05/24/20   Georgina Quint, MD    No Known Allergies  Patient Active Problem List   Diagnosis Date Noted   Stage 3a chronic kidney disease (HCC) 11/01/2022   Dyslipidemia 08/26/2019   Essential hypertension 06/19/2018    No past medical history on file.  No past surgical history on file.  Social History   Socioeconomic History   Marital status: Married    Spouse name: Not on file   Number of children: Not on file   Years of education: Not on file   Highest education level: Not on file  Occupational History   Not on file  Tobacco Use   Smoking status: Never   Smokeless tobacco: Never  Substance and Sexual Activity   Alcohol use: Not on file   Drug use: Not on file    Sexual activity: Not on file  Other Topics Concern   Not on file  Social History Narrative   Not on file   Social Determinants of Health   Financial Resource Strain: Not on file  Food Insecurity: Not on file  Transportation Needs: Not on file  Physical Activity: Not on file  Stress: Not on file  Social Connections: Not on file  Intimate Partner Violence: Not on file    No family history on file.   Review of Systems  Constitutional: Negative.  Negative for chills and fever.  HENT: Negative.  Negative for congestion and sore throat.   Respiratory: Negative.  Negative for cough and shortness of breath.   Cardiovascular: Negative.  Negative for chest pain and palpitations.  Gastrointestinal:  Negative for abdominal pain, diarrhea, nausea and vomiting.  Genitourinary: Negative.  Negative for dysuria and hematuria.  Musculoskeletal:  Positive for back pain.  Skin: Negative.  Negative for rash.  Neurological: Negative.  Negative for dizziness and headaches.  All other systems reviewed and are negative.   Vitals:   01/18/23 1424  BP: 130/78  Pulse: 77  Temp: 99.2 F (37.3 C)  SpO2: 97%    Physical Exam Vitals reviewed.  Constitutional:      Appearance: Normal appearance.  HENT:     Head: Normocephalic.  Eyes:     Extraocular Movements: Extraocular movements intact.  Cardiovascular:  Rate and Rhythm: Normal rate.  Pulmonary:     Effort: Pulmonary effort is normal.  Abdominal:     Palpations: Abdomen is soft.     Tenderness: There is no abdominal tenderness.  Musculoskeletal:     Lumbar back: Tenderness present. No bony tenderness. Decreased range of motion.     Right lower leg: No edema.     Left lower leg: No edema.  Skin:    General: Skin is warm and dry.     Capillary Refill: Capillary refill takes less than 2 seconds.  Neurological:     General: No focal deficit present.     Mental Status: He is alert and oriented to person, place, and time.   Psychiatric:        Mood and Affect: Mood normal.        Behavior: Behavior normal.    DG Lumbar Spine 2-3 Views  Result Date: 01/18/2023 CLINICAL DATA:  Low back pain for 1 month.  No known injury. EXAM: LUMBAR SPINE - 2-3 VIEW COMPARISON:  Abdominopelvic CT 11/25/2021 FINDINGS: There are 5 lumbar type vertebral bodies. The alignment is normal. The disc spaces are preserved. No evidence of acute fracture or pars defect. A surgical clip overlies the lower pole of the left kidney, grossly stable. IMPRESSION: No acute osseous findings or significant spondylosis. Electronically Signed   By: Carey Bullocks M.D.   On: 01/18/2023 15:26     ASSESSMENT & PLAN: A total of 44 minutes was spent with the patient and counseling/coordination of care regarding preparing for this visit, review of most recent office visit notes, review of chronic medical conditions under management, review of all medications and changes made, cardiovascular risks associated with uncontrolled hypertension, differential diagnosis of right flank pain, review of today's x-ray report, prognosis, documentation and need for follow-up.  Problem List Items Addressed This Visit       Cardiovascular and Mediastinum   Essential hypertension - Primary    Uncontrolled hypertension. Recommend to stop losartan and start valsartan HCTZ 160-12.5 mg daily Cardiovascular risks associated with hypertension discussed Diet and nutrition discussed.      Relevant Medications   valsartan-hydrochlorothiazide (DIOVAN-HCT) 160-12.5 MG tablet     Genitourinary   Stage 3a chronic kidney disease (HCC)    Much improved.  Last GFR over 80.  Back to normal.        Other   Dyslipidemia   Right flank pain    Most likely musculoskeletal in nature Pain management discussed Okay to take Tylenol.  Advised against NSAIDs Given kidney history recommend to do right kidney ultrasound      Relevant Orders   US Renal   DG Lumbar Spine 2-3 Views  (Completed)   History of kidney disease   Relevant Orders   US Renal   Patient Instructions  Hipertensin en los adultos Hypertension, Adult El trmino hipertensin es otra forma de denominar a la presin arterial elevada. La presin arterial elevada fuerza al corazn a trabajar ms para bombear la sangre. Esto puede causar problemas con el paso del [place]. Una lectura de presin arterial est compuesta por 2 nmeros. Hay un nmero superior (sistlico) sobre un nmero inferior (diastlico). Lo ideal es tener la presin arterial por debajo de 120/80. Cules son las causas? Se desconoce la causa de esta afeccin. Algunas otras afecciones pueden provocar presin arterial elevada. Qu incrementa el riesgo? Algunos factores del estilo de vida pueden hacer que tenga ms probabilidades de desarrollar presin arterial elevada: Fumar. No  hacer la cantidad suficiente de actividad fsica o ejercicio. Tener sobrepeso. Consumir mucha grasa, azcar, caloras o sal (sodio) en su dieta. Beber alcohol en exceso. Otros factores de riesgo son los siguientes: Tener alguna de estas afecciones: Enfermedad cardaca. Diabetes. Colesterol alto. Enfermedad renal. Apnea obstructiva del sueo. Tener antecedentes familiares de presin arterial elevada y colesterol elevado. Edad. El riesgo aumenta con la edad. Estrs. Cules son los signos o sntomas? Es posible que la presin arterial alta no cause sntomas. La presin arterial muy alta (crisis hipertensiva) puede provocar: Dolor de cabeza. Latidos cardacos acelerados o irregulares (palpitaciones). Falta de aire. Hemorragia nasal. Vomitar o sentir ganas de vomitar (nuseas). Cambios en la forma de ver. Dolor muy intenso en el pecho. Sensacin de [employer]. Convulsiones. Cmo se trata? Esta afeccin se trata haciendo cambios saludables en el estilo de vida, por ejemplo: Consumir alimentos saludables. Hacer ms ejercicio. Beber menos alcohol. El  mdico puede recetarle medicamentos si los cambios en el estilo de vida no son lo suficientemente eficaces y si: El nmero de arriba est por encima de 130. El nmero de abajo est por encima de 80. Su presin arterial personal ideal puede variar. Siga estas indicaciones en su casa: Comida y bebida  Si se lo dicen, siga el plan de alimentacin de DASH (Dietary Approaches to Stop Hypertension, Maneras de alimentarse para detener la hipertensin). Para seguir este plan: Llene la mitad del plato de cada comida con frutas y verduras. Llene un cuarto del plato de cada comida con cereales integrales. Los cereales integrales incluyen pasta integral, arroz integral y pan integral. Coma y beba productos lcteos con bajo contenido de grasa, como leche descremada o yogur bajo en grasas. Llene un cuarto del plato de cada comida con protenas bajas en grasa (magras). Las protenas bajas en grasa incluyen pescado, pollo sin piel, huevos, frijoles y tofu. Evite consumir carne grasa, carne curada y procesada, o pollo con piel. Evite consumir alimentos prehechos o procesados. Limite la cantidad de sal en su dieta a menos de 1500 mg por da. No beba alcohol si: El mdico le indica que no lo haga. Est embarazada, puede estar embarazada o est tratando de [place]. Si bebe alcohol: Limite la cantidad que bebe a lo siguiente: De 0 a 1 medida por da para las mujeres. De 0 a 2 medidas por da para los hombres. Sepa cunta cantidad de alcohol hay en las bebidas que toma. En los [address], una medida equivale a una botella de cerveza de 12 oz (355 ml), un vaso de vino de 5 oz (148 ml) o un vaso de una bebida alcohlica de alta graduacin de 1 oz (44 ml). Estilo de vida  Trabaje con su mdico para mantenerse en un peso saludable o para perder peso. Pregntele a su mdico cul es el peso recomendable para usted. Realice al menos 30 minutos de ejercicio que haga que se acelere su corazn (ejercicio  Magazine features editor) la [employer] de la [place]. Estos pueden incluir caminar, nadar o andar en bicicleta. Realice al menos 30 minutos de ejercicio que fortalezca sus msculos (ejercicios de resistencia) al menos 3 das a la [place]. Estos pueden incluir levantar pesas o hacer Pilates. No fume ni consuma ningn producto que contenga nicotina o tabaco. Si necesita ayuda para dejar de consumir estos productos, consulte al mdico. Controle su presin arterial en su casa tal como le indic el mdico. Concurra a todas las visitas de seguimiento. Medicamentos Use los medicamentos de [place] y  los recetados solamente como se lo haya indicado el mdico. Siga cuidadosamente las indicaciones. No omita las dosis de medicamentos para la presin arterial. Los medicamentos pierden eficacia si omite dosis. El hecho de omitir las dosis tambin [place] el riesgo de otros problemas. Pregntele a su mdico a qu efectos secundarios o reacciones a los Museum/gallery curator. Comunquese con un mdico si: Piensa que tiene [place] reaccin a los medicamentos que est tomando. Tiene dolores de cabeza frecuentes. Siente mareos. Tiene hinchazn en los tobillos. Tiene problemas de visin. Solicite ayuda de inmediato si: Siente un dolor de cabeza muy intenso. Empieza a sentirse desorientado (confundido). Se siente dbil o adormecido. Siente que va a desmayarse. Tiene un dolor muy intenso en: Pecho. Vientre (abdomen). Vomita ms de una vez. Tiene dificultad para respirar. Estos sntomas pueden Customer service manager. Solicite ayuda de inmediato. Llame al 911. No espere a ver si los sntomas desaparecen. No conduzca por sus propios medios [employer]. Resumen El trmino hipertensin es otra forma de denominar a la presin arterial elevada. La presin arterial elevada fuerza al corazn a trabajar ms para bombear la sangre. Para la [employer], una presin arterial normal es menor que  120/80. Las decisiones saludables pueden ayudarle a disminuir su presin arterial. Si no puede bajar su presin arterial mediante decisiones saludables, es posible que deba tomar medicamentos. Esta informacin no tiene Theme park manager el consejo del mdico. Asegrese de hacerle al mdico cualquier pregunta que tenga. Document Revised: 04/07/2021 Document Reviewed: 04/07/2021 Elsevier Patient Education  2024 Elsevier Inc.      Edwina Barth, MD [facility]

## 2023-02-19 ENCOUNTER — Ambulatory Visit
Admission: RE | Admit: 2023-02-19 | Discharge: 2023-02-19 | Disposition: A | Discharge status: Home / Self Care | Payer: 59 | Source: Ambulatory Visit | Attending: Emergency Medicine | Admitting: Emergency Medicine

## 2023-02-19 DIAGNOSIS — I129 Hypertensive chronic kidney disease with stage 1 through stage 4 chronic kidney disease, or unspecified chronic kidney disease: Secondary | ICD-10-CM | POA: Diagnosis not present

## 2023-02-19 DIAGNOSIS — R109 Unspecified abdominal pain: Secondary | ICD-10-CM | POA: Diagnosis not present

## 2023-02-19 DIAGNOSIS — N182 Chronic kidney disease, stage 2 (mild): Secondary | ICD-10-CM | POA: Diagnosis not present

## 2023-02-19 DIAGNOSIS — Z87448 Personal history of other diseases of urinary system: Secondary | ICD-10-CM

## 2023-02-19 DIAGNOSIS — Q6212 Congenital occlusion of ureterovesical orifice: Secondary | ICD-10-CM | POA: Diagnosis not present

## 2023-02-19 DIAGNOSIS — N200 Calculus of kidney: Secondary | ICD-10-CM | POA: Diagnosis not present

## 2023-02-20 ENCOUNTER — Encounter: Payer: Self-pay | Admitting: Emergency Medicine

## 2023-04-16 ENCOUNTER — Ambulatory Visit: Payer: 59 | Admitting: Emergency Medicine

## 2023-05-20 IMAGING — US US RENAL
1 series · 14 of 25 positions shown · non-contrast
Comparison: None.

CLINICAL DATA: History of prior left renal surgery, initial
encounter

EXAM:
RENAL / URINARY TRACT ULTRASOUND COMPLETE

[Series 1: us renal · 0.22mm/px · 14 of 46 slices shown]
[im 1/46]
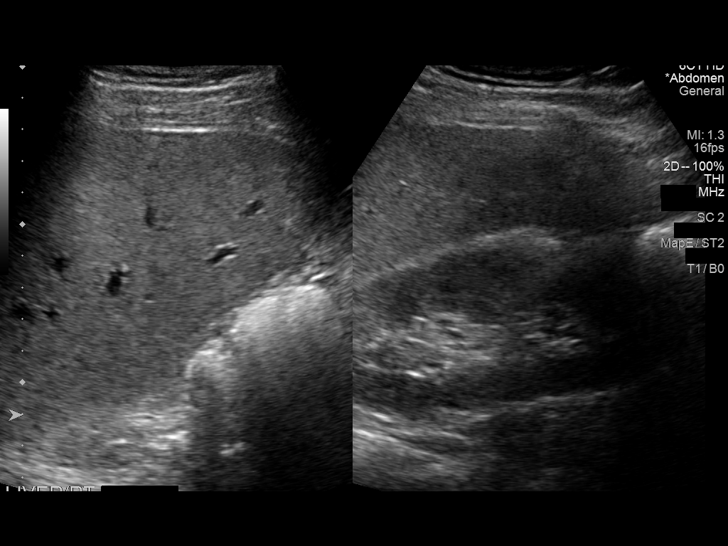
[im 4/46]
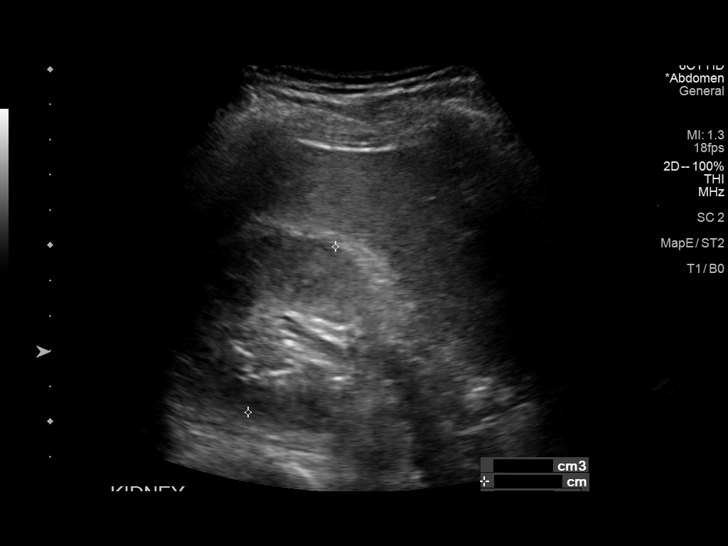
[im 8/46]
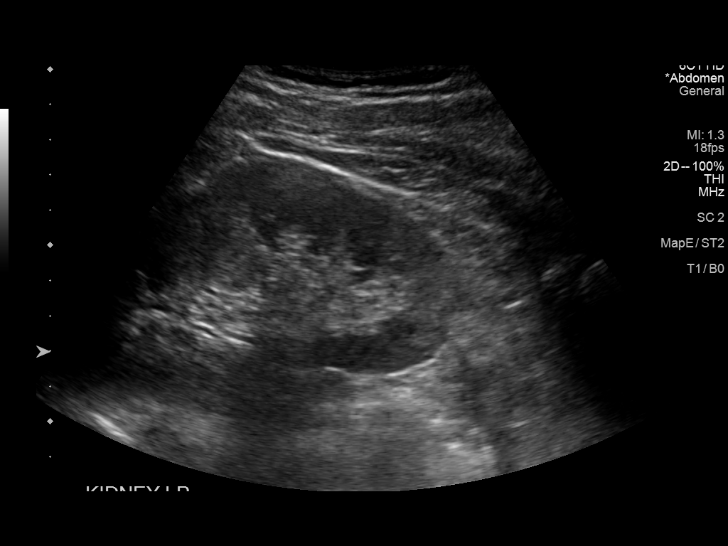
[im 12/46]
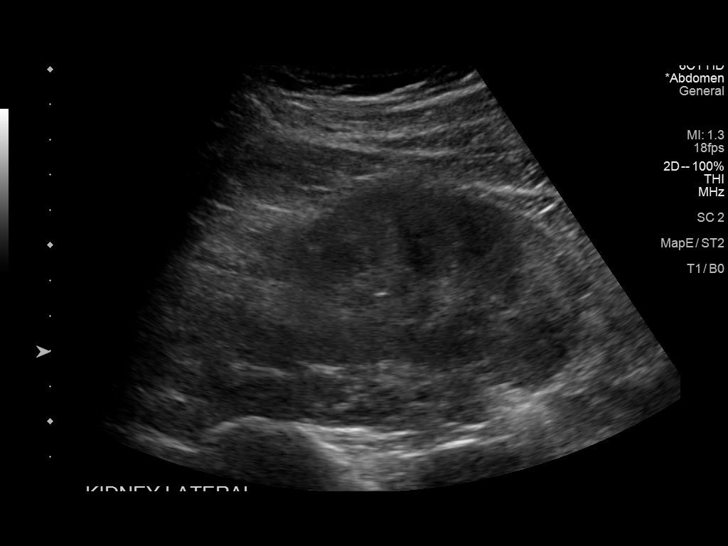
[im 16/46]
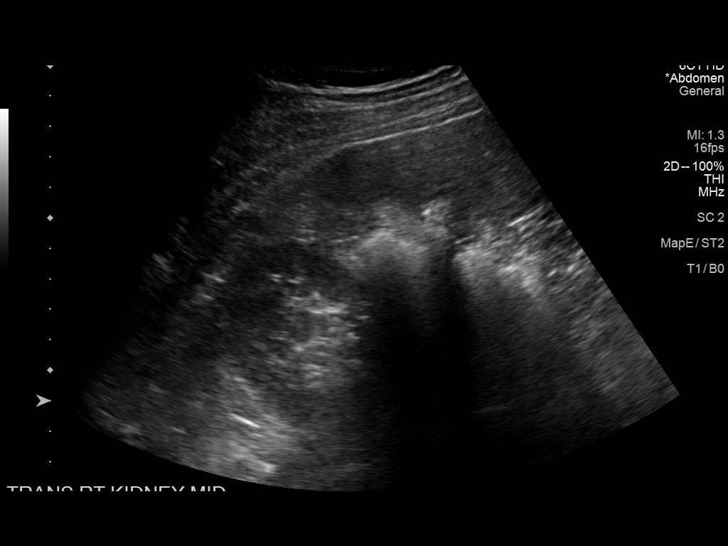
[im 17/46]
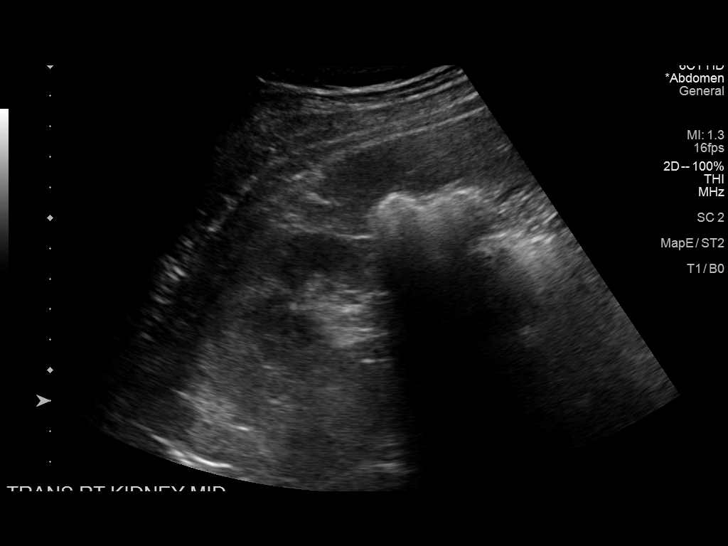
[im 21/46]
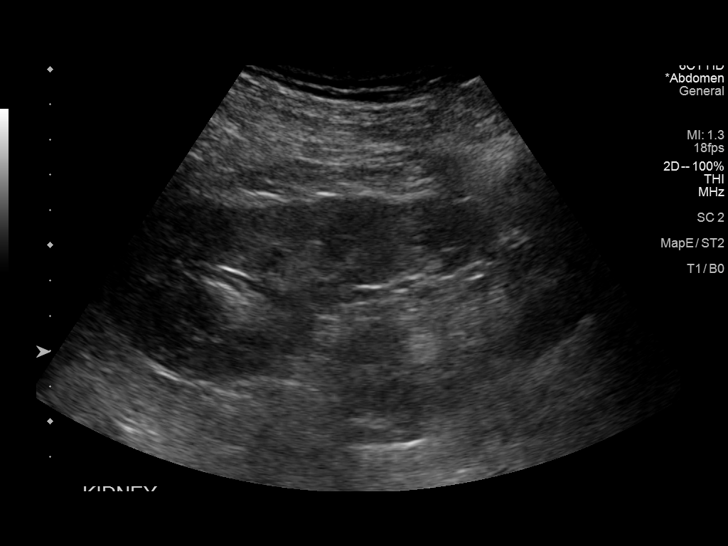
[im 25/46]
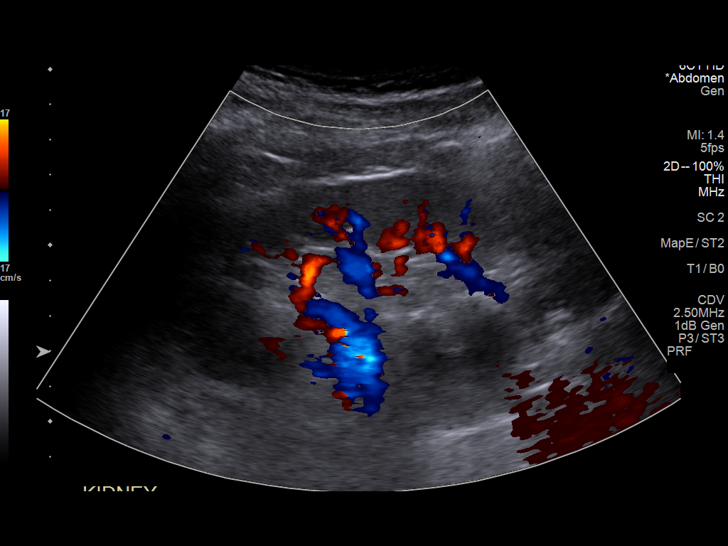
[im 29/46]
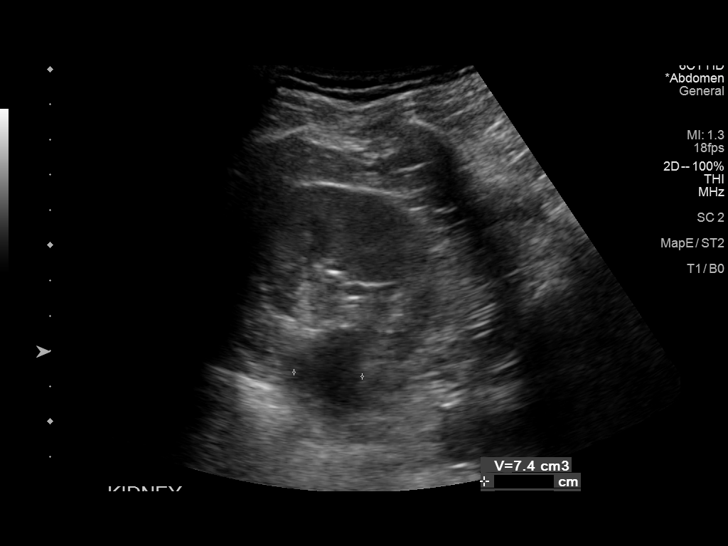
[im 31/46]
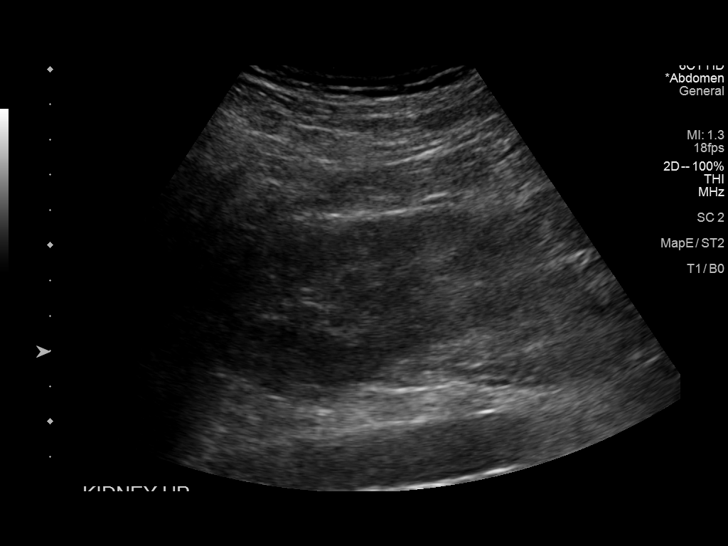
[im 34/46]
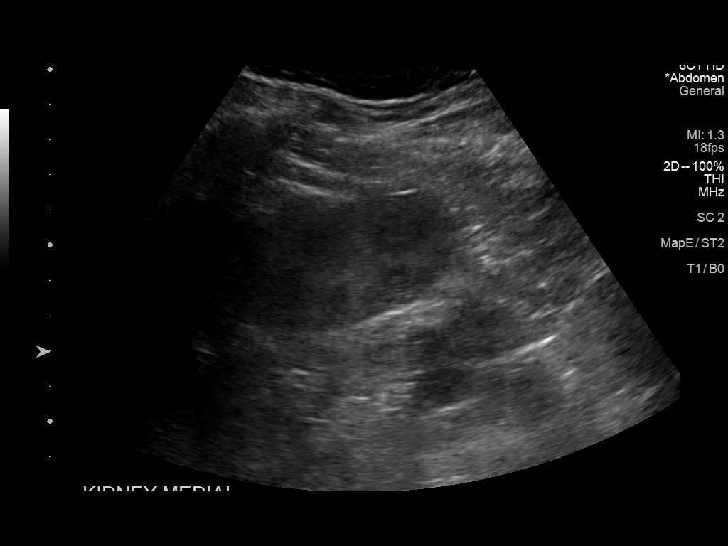
[im 38/46]
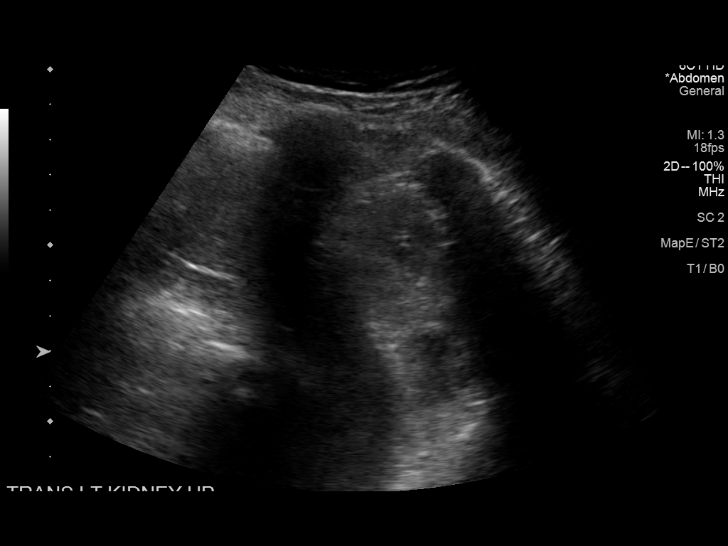
[im 42/46]
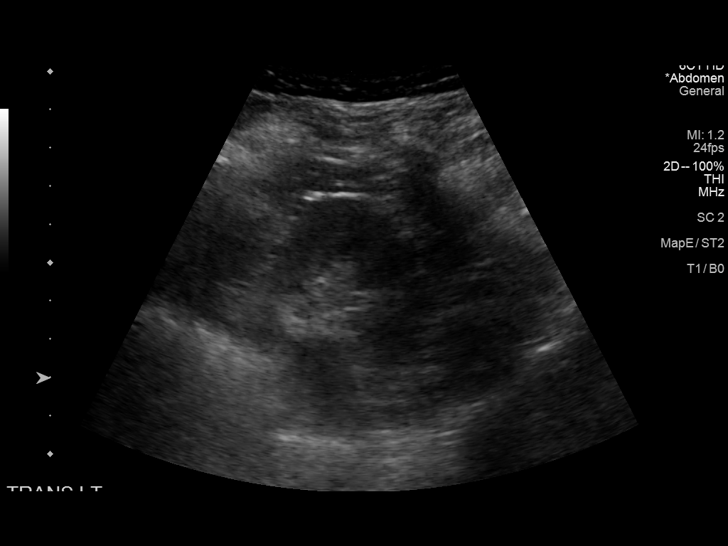
[im 46/46]
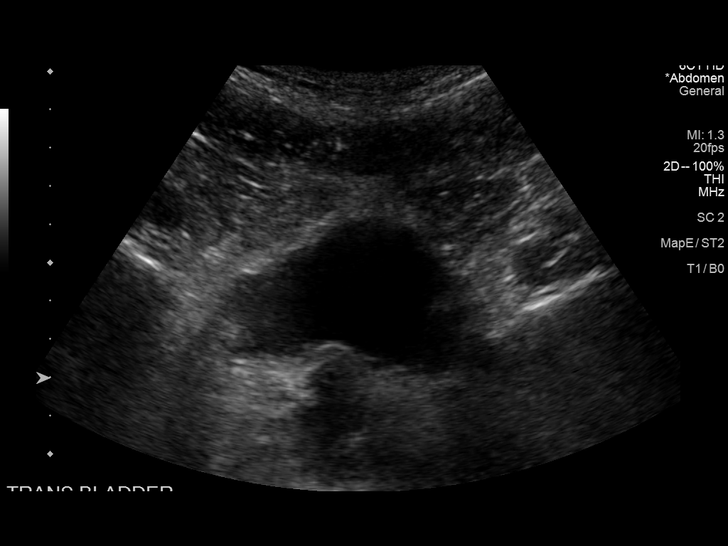

[14 of 25 positions shown; findings below may reference images not displayed]

FINDINGS: Right Kidney:

Renal measurements: 11.9 x 6.0 x 5.3 cm. = volume: 197 mL.
Echogenicity within normal limits. No mass or hydronephrosis
visualized.

Left Kidney:

Renal measurements: 14.0 x 5.3 x 5.7 cm. = volume: 220 mL. 3 cm
simple cyst is noted in the lower pole adjacent to the pelvis.

Bladder:

Appears normal for degree of bladder distention.

Other:

None.
IMPRESSION: Left renal cyst.  No other focal abnormality is noted.

## 2023-08-07 ENCOUNTER — Ambulatory Visit: Payer: Self-pay | Admitting: Emergency Medicine

## 2023-08-07 NOTE — Telephone Encounter (Signed)
 Copied from CRM [id number]. Topic: Clinical - Red Word Triage >> Aug 07, 2023  4:05 PM Taleah C wrote: Red Word that prompted transfer to Nurse Triage: high bp, 160/90   Chief Complaint: Elevated BP Symptoms: elevated blood pressure and headaches Pertinent Negatives: Patient denies blurred vision, chest pain, SOB, dizziness Disposition: [] ED /[] Urgent Care (no appt availability in office) / [x] Appointment(In office/virtual)/ []  [facility]/ [] Home Care/ [] Refused Recommended Disposition /[] [facility]/ []  Follow-up with PCP Additional Notes: Patient stated he has been experiencing elevated blood pressures for 2 weeks. His BP from today is 160/90. He stated he is taking blood pressure medicine and has not missed any doses. He has also been having headaches. He stated he has a mild headache at this time. Patient does not want to see any other provider. He only wants to see his PCP. Dr. Latrelle Dodrill next available appointment is on March 3rd. This RN is unable to schedule because the appointment is outside of the recommended time frame. Informed patient that a message will be sent to the clinic for approval. If provider is okay with patient being seen on March 3rd, please schedule.   Answer Assessment - Initial Assessment Questions 1. BLOOD PRESSURE: "What is the blood pressure?" "Did you take at least two measurements 5 minutes apart?"     160/90  2. ONSET: "When did you take your blood pressure?"     This morning  3. HOW: "How did you take your blood pressure?" (e.g., automatic home BP monitor, visiting nurse)     Home BP cuff  4. HISTORY: "Do you have a history of high blood pressure?"     Yes  5. MEDICINES: "Are you taking any medicines for blood pressure?" "Have you missed any doses recently?"     Taking BP med every day   6. OTHER SYMPTOMS: "Do you have any symptoms?" (e.g., blurred vision, chest pain, difficulty breathing, headache, weakness)     Intermittent  headaches, mild right now (almost gone)  Protocols used: Blood Pressure - High-A-AH

## 2023-08-09 ENCOUNTER — Encounter: Payer: Self-pay | Admitting: Emergency Medicine

## 2023-08-15 ENCOUNTER — Encounter: Payer: Self-pay | Admitting: Emergency Medicine

## 2023-08-15 ENCOUNTER — Ambulatory Visit (INDEPENDENT_AMBULATORY_CARE_PROVIDER_SITE_OTHER): Payer: Self-pay | Admitting: Emergency Medicine

## 2023-08-15 VITALS — BP 118/82 | HR 81 | Temp 98.5°F | Ht 66.0 in | Wt 190.0 lb

## 2023-08-15 DIAGNOSIS — N1831 Chronic kidney disease, stage 3a: Secondary | ICD-10-CM

## 2023-08-15 DIAGNOSIS — I10 Essential (primary) hypertension: Secondary | ICD-10-CM

## 2023-08-15 DIAGNOSIS — E785 Hyperlipidemia, unspecified: Secondary | ICD-10-CM

## 2023-08-15 LAB — MICROALBUMIN / CREATININE URINE RATIO
Creatinine,U: 131.7 mg/dL
Microalb Creat Ratio: 5.3 mg/g (ref 0.0–30.0)
Microalb, Ur: <0.7 mg/dL (ref 0.0–1.9)

## 2023-08-15 LAB — COMPREHENSIVE METABOLIC PANEL
ALT: 58 U/L — ABNORMAL HIGH (ref 0–53)
AST: 76 U/L — ABNORMAL HIGH (ref 0–37)
Albumin: 4.6 g/dL (ref 3.5–5.2)
Alkaline Phosphatase: 65 U/L (ref 39–117)
BUN: 22 mg/dL (ref 6–23)
CO2: 28 meq/L (ref 19–32)
Calcium: 9.8 mg/dL (ref 8.4–10.5)
Chloride: 103 meq/L (ref 96–112)
Creatinine, Ser: 1.19 mg/dL (ref 0.40–1.50)
GFR: 73.27 mL/min (ref >60.00)
Glucose, Bld: 101 mg/dL — ABNORMAL HIGH (ref 70–99)
Potassium: 3.7 meq/L (ref 3.5–5.1)
Sodium: 137 meq/L (ref 135–145)
Total Bilirubin: 0.6 mg/dL (ref 0.2–1.2)
Total Protein: 7.4 g/dL (ref 6.0–8.3)

## 2023-08-15 LAB — LIPID PANEL
Cholesterol: 214 mg/dL — ABNORMAL HIGH (ref 0–200)
HDL: 67.3 mg/dL (ref >39.00)
LDL Cholesterol: 124 mg/dL — ABNORMAL HIGH (ref 0–99)
NonHDL: 146.71
Total CHOL/HDL Ratio: 3
Triglycerides: 116 mg/dL (ref 0.0–149.0)
VLDL: 23.2 mg/dL (ref 0.0–40.0)

## 2023-08-15 LAB — HEMOGLOBIN A1C: Hgb A1c MFr Bld: 6 % (ref 4.6–6.5)

## 2023-08-15 NOTE — Assessment & Plan Note (Signed)
 BP Readings from Last 3 Encounters:  08/15/23 118/82  01/18/23 130/78  11/01/22 128/80  Well-controlled hypertension Continue valsartan HCT 160-12.5 mg daily Cardiovascular risks associated with hypertension discussed Dietary approaches to stop hypertension discussed Benefits of exercise discussed Blood work done today Follow-up in 6 months

## 2023-08-15 NOTE — Progress Notes (Signed)
 Kyle Hancock 48 y.o.   Chief Complaint  Patient presents with   Hypertension    Patient states 2-3 weeks ago his bp was over 170. He states within the past 2 days it has been normal. Pt does have headaches in the morning and by 10am its goes away.     HISTORY OF PRESENT ILLNESS: This is a 48 y.o. male here for follow-up of chronic medical conditions including hypertension Had a couple of high readings over the past several days.  Thinks it is stress-induced. History of chronic kidney disease.  However last GFR was over 80. No other complaints or medical concerns today. BP Readings from Last 3 Encounters:  08/15/23 118/82  01/18/23 130/78  11/01/22 128/80     HPI   Prior to Admission medications   Medication Sig Start Date End Date Taking? Authorizing Provider  valsartan-hydrochlorothiazide (DIOVAN-HCT) 160-12.5 MG tablet Take 1 tablet by mouth daily. 01/18/23  Yes Mayci Haning, Eilleen Kempf, MD  Cyanocobalamin (VITAMIN B 12 PO) Take by mouth daily. Patient not taking: Reported on 08/15/2023    [provider]  hydrocortisone (ANUSOL-HC) 2.5 % rectal cream Place 1 application rectally 2 (two) times daily. Patient not taking: Reported on 08/15/2023 05/24/20   Georgina Quint, MD    No Known Allergies  Patient Active Problem List   Diagnosis Date Noted   History of kidney disease 01/18/2023   Stage 3a chronic kidney disease (HCC) 11/01/2022   Dyslipidemia 08/26/2019   Essential hypertension 06/19/2018    History reviewed. No pertinent past medical history.  History reviewed. No pertinent surgical history.  Social History   Socioeconomic History   Marital status: Married    Spouse name: Not on file   Number of children: Not on file   Years of education: Not on file   Highest education level: Not on file  Occupational History   Not on file  Tobacco Use   Smoking status: Never   Smokeless tobacco: Never  Substance and Sexual Activity   Alcohol  use: Not on file   Drug use: Not on file   Sexual activity: Not on file  Other Topics Concern   Not on file  Social History Narrative   Not on file   Social Drivers of Health   Financial Resource Strain: Not on file  Food Insecurity: Not on file  Transportation Needs: Not on file  Physical Activity: Not on file  Stress: Not on file  Social Connections: Not on file  Intimate Partner Violence: Not on file    History reviewed. No pertinent family history.   Review of Systems  Constitutional: Negative.  Negative for chills and fever.  HENT: Negative.  Negative for congestion and sore throat.   Respiratory: Negative.  Negative for cough and shortness of breath.   Cardiovascular: Negative.  Negative for chest pain and palpitations.  Gastrointestinal:  Negative for abdominal pain, diarrhea, nausea and vomiting.  Genitourinary: Negative.  Negative for dysuria and hematuria.  Skin: Negative.  Negative for rash.  Neurological: Negative.  Negative for dizziness and headaches.  All other systems reviewed and are negative.   Vitals:   08/15/23 1411  BP: 118/82  Pulse: 81  Temp: 98.5 F (36.9 C)  SpO2: 97%    Physical Exam Vitals reviewed.  Constitutional:      Appearance: Normal appearance.  HENT:     Head: Normocephalic.     Right Ear: Tympanic membrane, ear canal and external ear normal.  Left Ear: Tympanic membrane, ear canal and external ear normal.     Mouth/Throat:     Mouth: Mucous membranes are moist.     Pharynx: Oropharynx is clear.  Eyes:     Extraocular Movements: Extraocular movements intact.     Conjunctiva/sclera: Conjunctivae normal.     Pupils: Pupils are equal, round, and reactive to light.  Cardiovascular:     Rate and Rhythm: Normal rate and regular rhythm.     Pulses: Normal pulses.     Heart sounds: Normal heart sounds.  Pulmonary:     Effort: Pulmonary effort is normal.     Breath sounds: Normal breath sounds.  Abdominal:     General:  There is no distension.     Palpations: Abdomen is soft.     Tenderness: There is no abdominal tenderness.  Musculoskeletal:     Cervical back: No tenderness.     Right lower leg: No edema.     Left lower leg: No edema.  Lymphadenopathy:     Cervical: No cervical adenopathy.  Skin:    General: Skin is warm and dry.     Capillary Refill: Capillary refill takes less than 2 seconds.  Neurological:     General: No focal deficit present.     Mental Status: He is alert and oriented to person, place, and time.  Psychiatric:        Mood and Affect: Mood normal.        Behavior: Behavior normal.      ASSESSMENT & PLAN: A total of 44 minutes was spent with the patient and counseling/coordination of care regarding preparing for this visit, review of most recent office visit notes, review of multiple chronic medical conditions and their management, cardiovascular risks associated with hypertension, review of all medications, review of most recent bloodwork results, review of health maintenance items, education on nutrition, prognosis, documentation, and need for follow up.   Problem List Items Addressed This Visit       Cardiovascular and Mediastinum   Essential hypertension   BP Readings from Last 3 Encounters:  08/15/23 118/82  01/18/23 130/78  11/01/22 128/80  Well-controlled hypertension Continue valsartan HCT 160-12.5 mg daily Cardiovascular risks associated with hypertension discussed Dietary approaches to stop hypertension discussed Benefits of exercise discussed Blood work done today Follow-up in 6 months       Relevant Orders   Microalbumin / creatinine urine ratio   CBC with Differential/Platelet   Comprehensive metabolic panel   Hemoglobin A1c   Lipid panel     Genitourinary   Stage 3a chronic kidney disease (HCC)   Much improved. Last GFR over 80. Back to normal.  CMP repeated today If abnormal GFR, will start Farxiga 10 mg daily      Relevant Orders    Microalbumin / creatinine urine ratio   CBC with Differential/Platelet   Comprehensive metabolic panel   Hemoglobin A1c   Lipid panel     Other   Dyslipidemia - Primary   Diet and nutrition discussed Lipid profile done today The 10-year ASCVD risk score (Arnett DK, et al., 2019) is: 1.6%   Values used to calculate the score:     Age: 21 years     Sex: Male     Is Non-Hispanic African American: No     Diabetic: No     Tobacco smoker: No     Systolic Blood Pressure: 118 mmHg     Is BP treated: Yes     HDL  Cholesterol: 61.6 mg/dL     Total Cholesterol: 190 mg/dL       Relevant Orders   Microalbumin / creatinine urine ratio   CBC with Differential/Platelet   Comprehensive metabolic panel   Hemoglobin A1c   Lipid panel   Patient Instructions  Mantenimiento de la salud en los hombres Health Maintenance, Male Adoptar un estilo de vida saludable y recibir atencin preventiva son importantes para promover la salud y Counsellor. Consulte al mdico sobre: El esquema adecuado para hacerse pruebas y exmenes peridicos. Cosas que puede hacer por su cuenta para prevenir enfermedades y [place] sano. Qu debo saber sobre la dieta, el peso y el ejercicio? Consuma una dieta saludable  Consuma una dieta que incluya muchas verduras, frutas, productos lcteos con bajo contenido de [place] y Associate Professor. No consuma muchos alimentos ricos en grasas slidas, azcares agregados o sodio. Mantenga un peso saludable El ndice de masa muscular [facility]) es una medida que puede utilizarse para identificar posibles problemas de [place]. Proporciona una estimacin de la grasa corporal basndose en el peso y la altura. Su mdico puede ayudarle a Engineer, site IMC y a Personnel officer o Pharmacologist un peso saludable. Haga ejercicio con regularidad Haga ejercicio con regularidad. Esta es una de las prcticas ms importantes que puede hacer por su salud. La [employer] de los adultos deben seguir estas pautas: Education officer, environmental,  al menos, 150 minutos de actividad fsica por semana. El ejercicio debe aumentar la frecuencia cardaca y Media planner transpirar (ejercicio de intensidad moderada). Hacer ejercicios de fortalecimiento por lo [employer] por semana. Agregue esto a su plan de ejercicio de intensidad moderada. Pase menos tiempo sentado. Incluso la actividad fsica ligera puede ser beneficiosa. Controle sus niveles de colesterol y lpidos en la sangre Comience a realizarse anlisis de lpidos y Oncologist en la sangre a los 20 aos y luego reptalos cada 5 aos. Es posible que Insurance underwriter los niveles de colesterol con mayor frecuencia si: Sus niveles de lpidos y colesterol son altos. Es mayor de 40 aos. Presenta un alto riesgo de padecer enfermedades cardacas. Qu debo saber sobre las pruebas de deteccin del cncer? Muchos tipos de cncer pueden detectarse de manera temprana y, a menudo, pueden prevenirse. Segn su historia clnica y sus antecedentes familiares, es posible que deba realizarse pruebas de deteccin del cncer en diferentes edades. Esto puede incluir pruebas de deteccin de lo siguiente: Building services engineer. Cncer de prstata. Cncer de piel. Cncer de pulmn. Qu debo saber sobre la enfermedad cardaca, la diabetes y la hipertensin arterial? Presin arterial y enfermedad cardaca La hipertensin arterial causa enfermedades cardacas y [place] el riesgo de accidente cerebrovascular. Es ms probable que esto se manifieste en las personas que tienen lecturas de presin arterial alta o tienen sobrepeso. Hable con el mdico sobre sus valores de presin arterial deseados. Hgase controlar la presin arterial: Cada 3 a 5 aos si tiene entre 18 y 7 aos. Todos los aos si es mayor de 40 aos. Si tiene entre 65 y 33 aos y es fumador o Insurance underwriter, pregntele al mdico si debe realizarse una prueba de deteccin de aneurisma artico abdominal (AAA) por nica vez. Diabetes Realcese exmenes de  deteccin de la diabetes con regularidad. Este anlisis revisa el nivel de azcar en la sangre en [place]. Hgase las pruebas de deteccin: Cada tres aos despus de los 45 aos de edad si tiene un peso normal y un bajo riesgo de padecer diabetes. Con ms frecuencia y a partir de [place] edad inferior  si tiene sobrepeso o un alto riesgo de padecer diabetes. Qu debo saber sobre la prevencin de infecciones? Hepatitis B Si tiene un riesgo ms alto de contraer hepatitis B, debe someterse a un examen de deteccin de este virus. Hable con el mdico para averiguar si tiene riesgo de contraer la infeccin por hepatitis B. Hepatitis C Se recomienda un anlisis de [place] para: Todos los que nacieron entre 1945 y [phone]. Todas las personas que tengan un riesgo de haber contrado hepatitis C. Enfermedades de transmisin sexual (ETS) Debe realizarse pruebas de deteccin de ITS todos los aos, incluidas la gonorrea y la clamidia, si: Es sexualmente activo y es menor de [address]. Es mayor de [address], y Public affairs consultant informa que corre riesgo de tener este tipo de infecciones. La actividad sexual ha cambiado desde que le hicieron la ltima prueba de deteccin y tiene un riesgo mayor de Warehouse manager clamidia o Copy. Pregntele al mdico si usted tiene riesgo. Pregntele al mdico si usted tiene un alto riesgo de Primary school teacher VIH. El mdico tambin puede recomendarle un medicamento recetado para ayudar a evitar la infeccin por el VIH. Si elige tomar medicamentos para prevenir el VIH, primero debe [employer] de deteccin del VIH. Luego debe hacerse anlisis cada 3 meses mientras est tomando los medicamentos. Siga estas indicaciones en su casa: Consumo de alcohol No beba alcohol si el mdico se lo prohbe. Si bebe alcohol: Limite la cantidad que consume de 0 a 2 bebidas por da. Sepa cunta cantidad de alcohol hay en las bebidas que toma. En los [address], una medida equivale a una botella de cerveza de 12 oz  (355 ml), un vaso de vino de 5 oz (148 ml) o un vaso de una bebida alcohlica de alta graduacin de 1 oz (44 ml). Estilo de vida No consuma ningn producto que contenga nicotina o tabaco. Estos productos incluyen cigarrillos, tabaco para Theatre manager y aparatos de vapeo, como los Administrator, Civil Service. Si necesita ayuda para dejar de consumir estos productos, consulte al mdico. No consuma drogas. No comparta agujas. Solicite ayuda a su mdico si necesita apoyo o informacin para abandonar las drogas. Indicaciones generales Realcese los estudios de rutina de [address], dentales y de Wellsite geologist. Mantngase al da con las vacunas. Infrmele a su mdico si: Se siente deprimido con frecuencia. Alguna vez ha sido vctima de [place] o no se siente seguro en su casa. Resumen Adoptar un estilo de vida saludable y recibir atencin preventiva son importantes para promover la salud y Counsellor. Siga las instrucciones del mdico acerca de una dieta saludable, el ejercicio y la realizacin de pruebas o exmenes para Hotel manager. Siga las instrucciones del mdico con respecto al control del colesterol y la presin arterial. Esta informacin no tiene Theme park manager el consejo del mdico. Asegrese de hacerle al mdico cualquier pregunta que tenga. Document Revised: 11/03/2020 Document Reviewed: 11/03/2020 Elsevier Patient Education  2024 Elsevier Inc.     Edwina Barth, MD [facility]

## 2023-08-15 NOTE — Assessment & Plan Note (Signed)
 Much improved. Last GFR over 80. Back to normal.  CMP repeated today If abnormal GFR, will start Farxiga 10 mg daily

## 2023-08-15 NOTE — Patient Instructions (Signed)
 Mantenimiento de Research officer, political party, Male Adoptar un estilo de vida saludable y recibir atencin preventiva son importantes para promover la salud y Counsellor. Consulte al mdico sobre: El esquema adecuado para hacerse pruebas y exmenes peridicos. Cosas que puede hacer por su cuenta para prevenir enfermedades y [place] sano. Qu debo saber sobre la dieta, el peso y el ejercicio? Consuma una dieta saludable  Consuma una dieta que incluya muchas verduras, frutas, productos lcteos con bajo contenido de [place] y Associate Professor. No consuma muchos alimentos ricos en grasas slidas, azcares agregados o sodio. Mantenga un peso saludable El ndice de masa muscular [facility]) es una medida que puede utilizarse para identificar posibles problemas de [place]. Proporciona una estimacin de la grasa corporal basndose en el peso y la altura. Su mdico puede ayudarle a Engineer, site IMC y a Personnel officer o Pharmacologist un peso saludable. Haga ejercicio con regularidad Haga ejercicio con regularidad. Esta es una de las prcticas ms importantes que puede hacer por su salud. La [employer] de los adultos deben seguir estas pautas: Education officer, environmental, al menos, 150 minutos de actividad fsica por semana. El ejercicio debe aumentar la frecuencia cardaca y Media planner transpirar (ejercicio de intensidad moderada). Hacer ejercicios de fortalecimiento por lo [employer] [employer] por semana. Agregue esto a su plan de ejercicio de intensidad moderada. Pase menos tiempo sentado. Incluso la actividad fsica ligera puede ser beneficiosa. Controle sus niveles de colesterol y lpidos en la sangre Comience a realizarse anlisis de lpidos y Oncologist en la sangre a los 20 aos y luego reptalos cada 5 aos. Es posible que Insurance underwriter los niveles de colesterol con mayor frecuencia si: Sus niveles de lpidos y colesterol son altos. Es mayor de 40 aos. Presenta un alto riesgo de padecer enfermedades cardacas. Qu debo  saber sobre las pruebas de deteccin del cncer? Muchos tipos de cncer pueden detectarse de manera temprana y, a menudo, pueden prevenirse. Segn su historia clnica y sus antecedentes familiares, es posible que deba realizarse pruebas de deteccin del cncer en diferentes edades. Esto puede incluir pruebas de deteccin de lo siguiente: Building services engineer. Cncer de prstata. Cncer de piel. Cncer de pulmn. Qu debo saber sobre la enfermedad cardaca, la diabetes y la hipertensin arterial? Presin arterial y enfermedad cardaca La hipertensin arterial causa enfermedades cardacas y [place] el riesgo de accidente cerebrovascular. Es ms probable que esto se manifieste en las personas que tienen lecturas de presin arterial alta o tienen sobrepeso. Hable con el mdico sobre sus valores de presin arterial deseados. Hgase controlar la presin arterial: Cada 3 a 5 aos si tiene entre 18 y 71 aos. Todos los aos si es mayor de 40 aos. Si tiene entre 65 y 26 aos y es fumador o Insurance underwriter, pregntele al mdico si debe realizarse una prueba de deteccin de aneurisma artico abdominal (AAA) por nica vez. Diabetes Realcese exmenes de deteccin de la diabetes con regularidad. Este anlisis revisa el nivel de azcar en la sangre en [place]. Hgase las pruebas de deteccin: Cada tres aos despus de los 45 aos de edad si tiene un peso normal y un bajo riesgo de padecer diabetes. Con ms frecuencia y a partir de [place] edad inferior si tiene sobrepeso o un alto riesgo de padecer diabetes. Qu debo saber sobre la prevencin de infecciones? Hepatitis B Si tiene un riesgo ms alto de contraer hepatitis B, debe someterse a un examen de deteccin de este virus. Hable con el mdico para averiguar si tiene riesgo de  contraer la infeccin por hepatitis B. Hepatitis C Se recomienda un anlisis de [place] para: Todos los que nacieron entre 1945 y [phone]. Todas las personas que tengan un riesgo de haber  contrado hepatitis C. Enfermedades de transmisin sexual (ETS) Debe realizarse pruebas de deteccin de ITS todos los aos, incluidas la gonorrea y la clamidia, si: Es sexualmente activo y es menor de [address]. Es mayor de [address], y Public affairs consultant informa que corre riesgo de tener este tipo de infecciones. La actividad sexual ha cambiado desde que le hicieron la ltima prueba de deteccin y tiene un riesgo mayor de Warehouse manager clamidia o Copy. Pregntele al mdico si usted tiene riesgo. Pregntele al mdico si usted tiene un alto riesgo de Primary school teacher VIH. El mdico tambin puede recomendarle un medicamento recetado para ayudar a evitar la infeccin por el VIH. Si elige tomar medicamentos para prevenir el VIH, primero debe [employer] de deteccin del VIH. Luego debe hacerse anlisis cada 3 meses mientras est tomando los medicamentos. Siga estas indicaciones en su casa: Consumo de alcohol No beba alcohol si el mdico se lo prohbe. Si bebe alcohol: Limite la cantidad que consume de 0 a 2 bebidas por da. Sepa cunta cantidad de alcohol hay en las bebidas que toma. En los [address], una medida equivale a una botella de cerveza de 12 oz (355 ml), un vaso de vino de 5 oz (148 ml) o un vaso de una bebida alcohlica de alta graduacin de 1 oz (44 ml). Estilo de vida No consuma ningn producto que contenga nicotina o tabaco. Estos productos incluyen cigarrillos, tabaco para Theatre manager y aparatos de vapeo, como los Administrator, Civil Service. Si necesita ayuda para dejar de consumir estos productos, consulte al mdico. No consuma drogas. No comparta agujas. Solicite ayuda a su mdico si necesita apoyo o informacin para abandonar las drogas. Indicaciones generales Realcese los estudios de rutina de [address], dentales y de Wellsite geologist. Mantngase al da con las vacunas. Infrmele a su mdico si: Se siente deprimido con frecuencia. Alguna vez ha sido vctima de [place] o no se siente seguro en su  casa. Resumen Adoptar un estilo de vida saludable y recibir atencin preventiva son importantes para promover la salud y Counsellor. Siga las instrucciones del mdico acerca de una dieta saludable, el ejercicio y la realizacin de pruebas o exmenes para Hotel manager. Siga las instrucciones del mdico con respecto al control del colesterol y la presin arterial. Esta informacin no tiene Theme park manager el consejo del mdico. Asegrese de hacerle al mdico cualquier pregunta que tenga. Document Revised: 11/03/2020 Document Reviewed: 11/03/2020 Elsevier Patient Education  2024 [employer].

## 2023-08-15 NOTE — Assessment & Plan Note (Signed)
 Diet and nutrition discussed Lipid profile done today The 10-year ASCVD risk score (Arnett DK, et al., 2019) is: 1.6%   Values used to calculate the score:     Age: 48 years     Sex: Male     Is Non-Hispanic African American: No     Diabetic: No     Tobacco smoker: No     Systolic Blood Pressure: 118 mmHg     Is BP treated: Yes     HDL Cholesterol: 61.6 mg/dL     Total Cholesterol: 190 mg/dL

## 2023-08-16 ENCOUNTER — Encounter: Payer: Self-pay | Admitting: Emergency Medicine

## 2023-08-16 LAB — CBC WITH DIFFERENTIAL/PLATELET
Basophils Absolute: 0.1 10*3/uL (ref 0.0–0.1)
Basophils Relative: 0.7 % (ref 0.0–3.0)
Eosinophils Absolute: 0.1 10*3/uL (ref 0.0–0.7)
Eosinophils Relative: 1.2 % (ref 0.0–5.0)
HCT: 43.1 % (ref 39.0–52.0)
Hemoglobin: 14 g/dL (ref 13.0–17.0)
Lymphocytes Relative: 32.6 % (ref 12.0–46.0)
Lymphs Abs: 2.3 10*3/uL (ref 0.7–4.0)
MCHC: 32.6 g/dL (ref 30.0–36.0)
MCV: 79.8 fl (ref 78.0–100.0)
Monocytes Absolute: 0.4 10*3/uL (ref 0.1–1.0)
Monocytes Relative: 5.9 % (ref 3.0–12.0)
Neutro Abs: 4.3 10*3/uL (ref 1.4–7.7)
Neutrophils Relative %: 59.6 % (ref 43.0–77.0)
Platelets: 234 10*3/uL (ref 150.0–400.0)
RBC: 5.41 Mil/uL (ref 4.22–5.81)
RDW: 13.4 % (ref 11.5–15.5)
WBC: 7.2 10*3/uL (ref 4.0–10.5)

## 2023-09-14 ENCOUNTER — Encounter: Payer: Self-pay | Admitting: Emergency Medicine

## 2023-09-17 ENCOUNTER — Other Ambulatory Visit: Payer: Self-pay | Admitting: Emergency Medicine

## 2023-09-17 DIAGNOSIS — H1013 Acute atopic conjunctivitis, bilateral: Secondary | ICD-10-CM

## 2023-09-17 MED ORDER — LEVOCETIRIZINE DIHYDROCHLORIDE 5 MG PO TABS: 5.0000 mg | ORAL_TABLET | Freq: Every evening | ORAL | 1 refills | Status: AC

## 2023-09-17 MED ORDER — OLOPATADINE HCL 0.2 % OP SOLN
2.0000 [drp] | Freq: Two times a day (BID) | OPHTHALMIC | 2 refills | Status: AC
Start: 2023-09-17 — End: ?

## 2023-09-17 NOTE — Telephone Encounter (Signed)
Prescription sent to pharmacy of record today.  Thanks.

## 2023-10-02 IMAGING — CT CT ABD-PELV W/O CM
2 of 3 series · 12 of 36 positions shown, 18 images · non-contrast
Comparison: Renal ultrasound dated 07/13/2021.

CLINICAL DATA: Chronic kidney disease.  Left flank pain.



[Series 2: abd/pelvis w/(date) · axial · 0.76mm/px · z∈[-650,-250]mm · 11 of 94 slices shown, 16 images]
[im 7/94  soft-tissue]
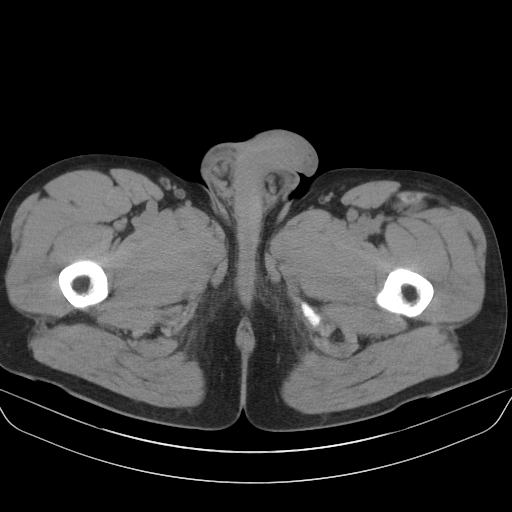
[im 7/94  bone]
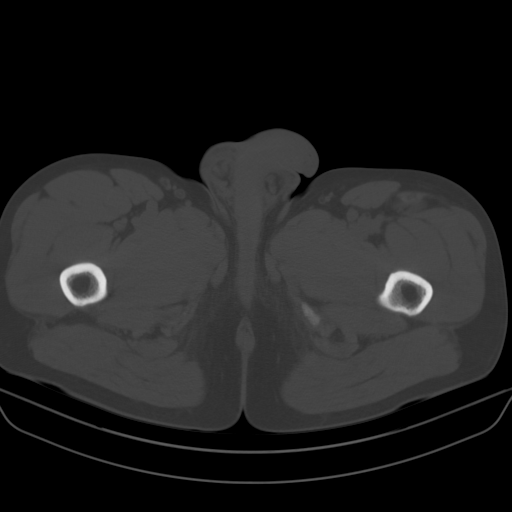
[im 19/94  soft-tissue]
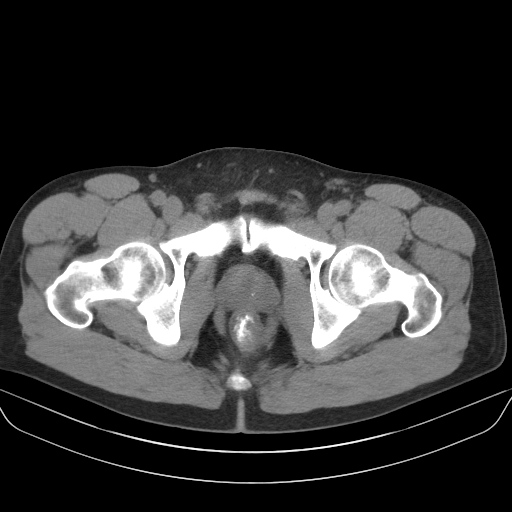
[im 25/94  soft-tissue]
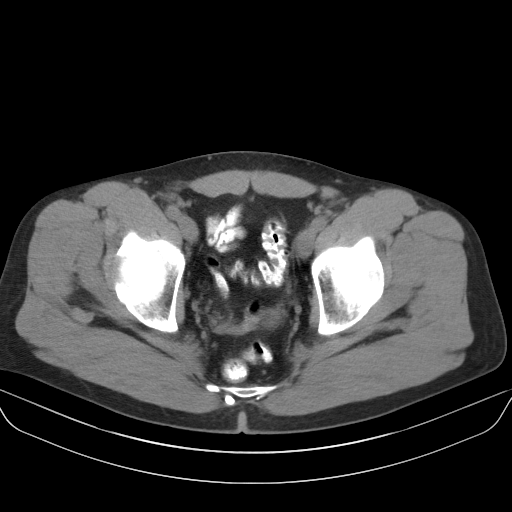
[im 32/94  soft-tissue]
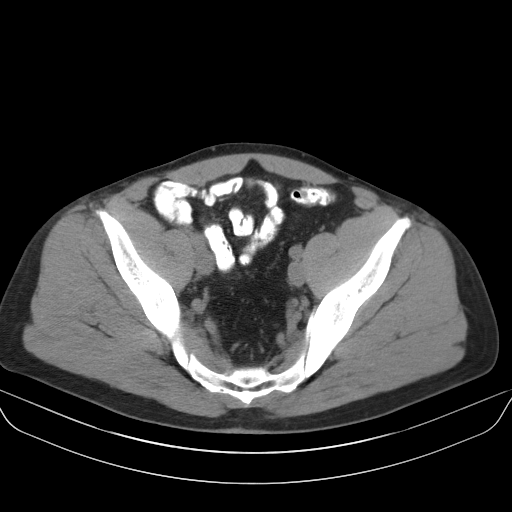
[im 44/94  soft-tissue]
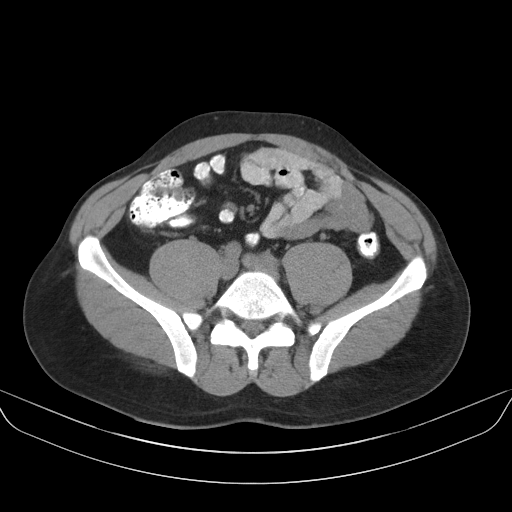
[im 50/94  soft-tissue]
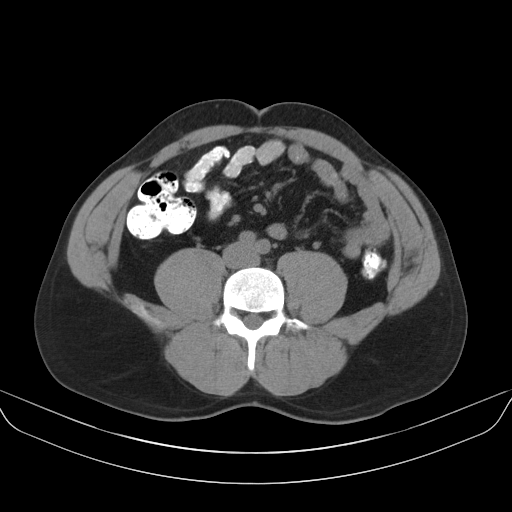
[im 63/94  soft-tissue]
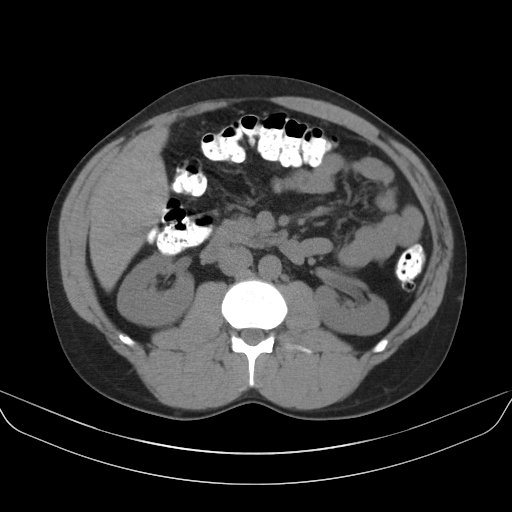
[im 69/94  soft-tissue]
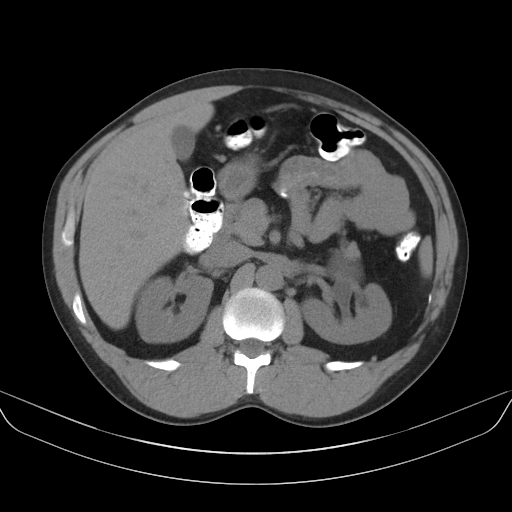
[im 69/94  lung]
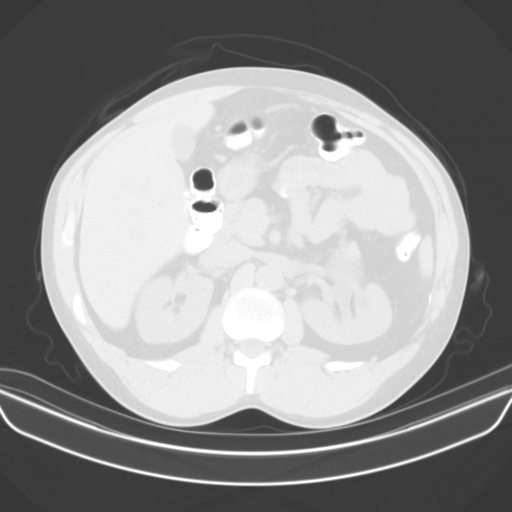
[im 75/94  soft-tissue]
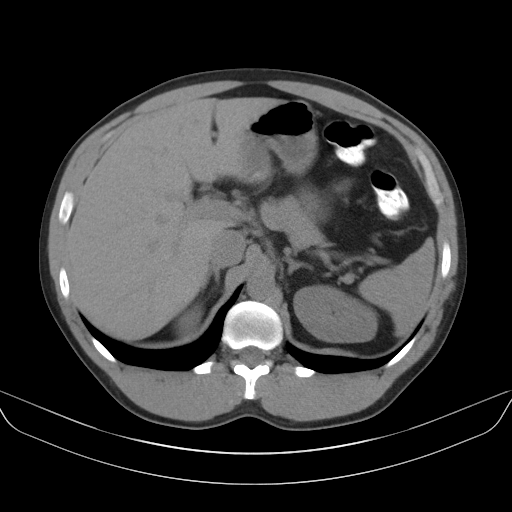
[im 75/94  lung]
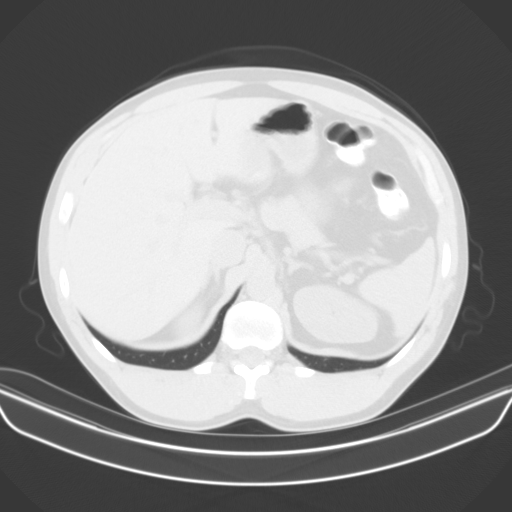
[im 75/94  bone]
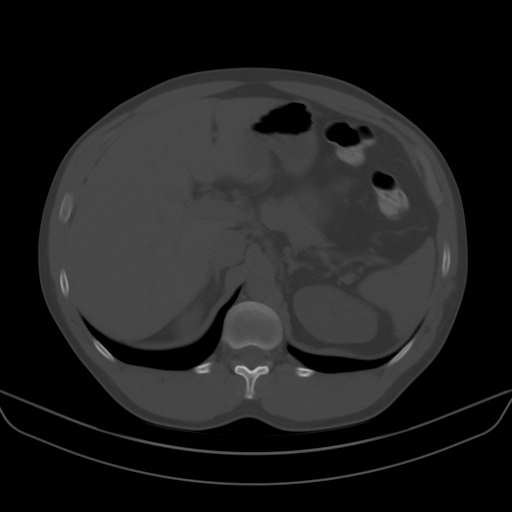
[im 81/94  lung]
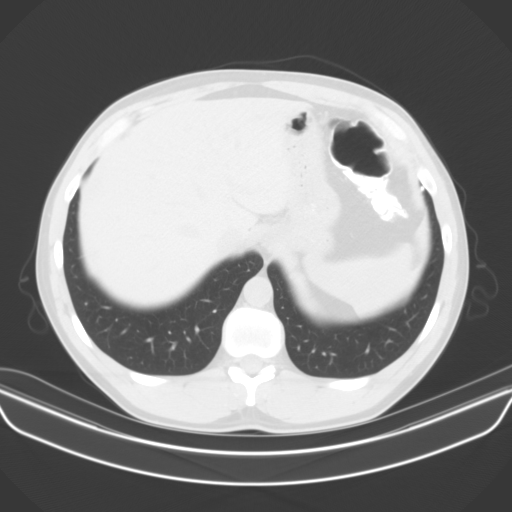
[im 87/94  soft-tissue]
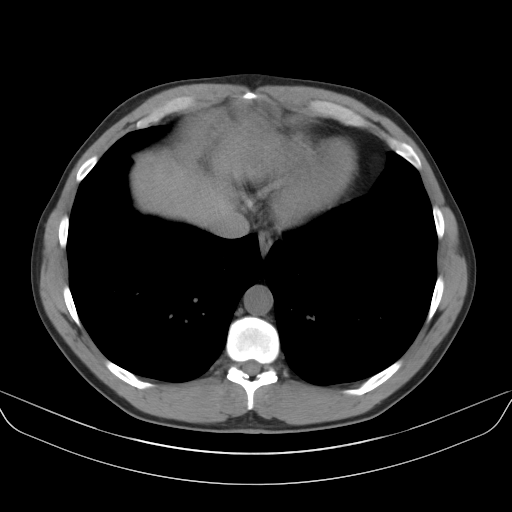
[im 87/94  lung]
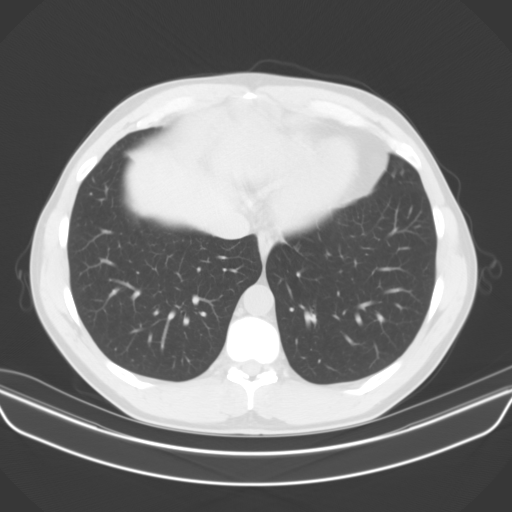

[Series 4: sag · sagittal · 0.64mm/px · 1 of 122 slices shown, 2 images]
[im 41/122  soft-tissue]
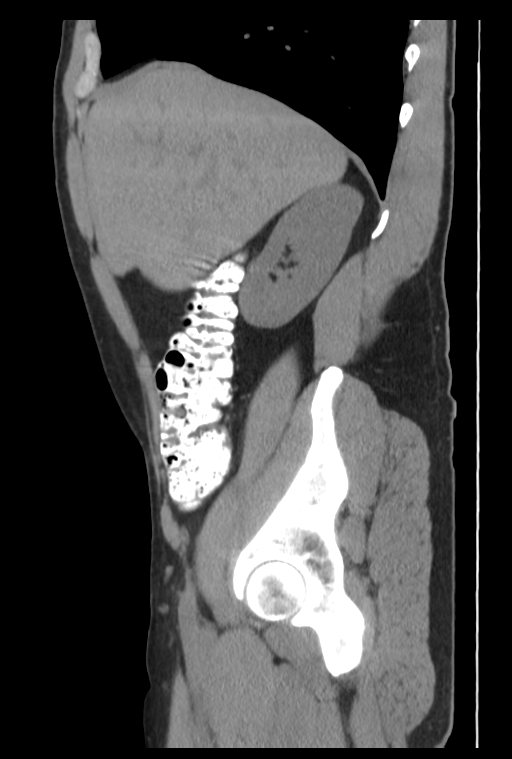
[im 41/122  bone]
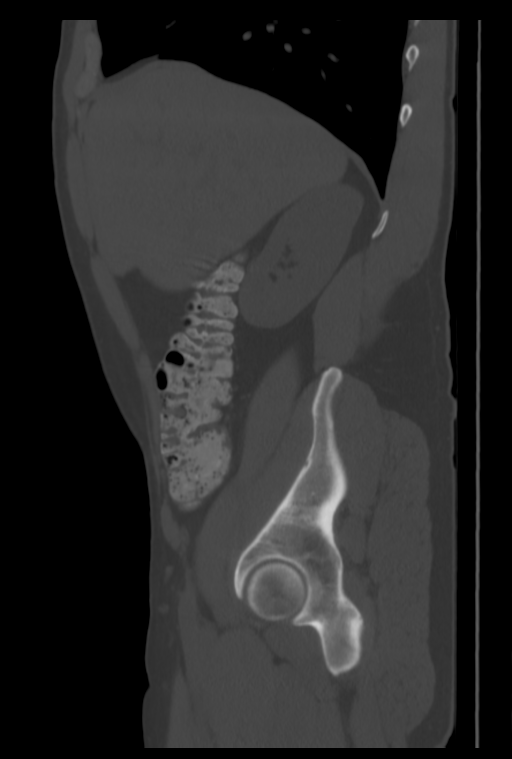

[12 of 36 positions shown; findings below may reference images not displayed]

FINDINGS: Evaluation of this exam is limited in the absence of intravenous
contrast.

Lower chest: The visualized lung bases are clear.

No intra-abdominal free air or free fluid.

Hepatobiliary: No focal liver abnormality is seen. No gallstones,
gallbladder wall thickening, or biliary dilatation.

Pancreas: Unremarkable. No pancreatic ductal dilatation or
surrounding inflammatory changes.

Spleen: Normal in size without focal abnormality.

Adrenals/Urinary Tract: The adrenal glands are unremarkable. There
is rated 5 mm linear calculus in the region of the left
ureteropelvic junction. There is an extrarenal pelvis with mild
pelviectasis and mild left hydronephrosis. There is a focal area of
parenchyma atrophy and cortical scarring of the inferior pole of the
left kidney. The right kidney, right ureter, and urinary bladder
appear unremarkable.

Stomach/Bowel: Several small scattered sigmoid diverticula without
active inflammatory changes. There is no bowel obstruction or active
inflammation. The appendix is not visualized with certainty. No
inflammatory changes identified in the right lower quadrant.

Vascular/Lymphatic: The abdominal aorta and IVC are grossly
unremarkable on this noncontrast CT. No portal venous gas. There is
no adenopathy.

Reproductive: The prostate and seminal vesicles are grossly
unremarkable. No pelvic mass.

Other: None

Musculoskeletal: No acute or significant osseous findings.
IMPRESSION: 1. A 5 mm linear calculus in the left UPJ with mild left
hydronephrosis.
2. Small scattered sigmoid diverticula. No bowel obstruction.

## 2024-02-01 ENCOUNTER — Encounter: Payer: Self-pay | Admitting: Emergency Medicine

## 2024-02-01 ENCOUNTER — Ambulatory Visit: Payer: Self-pay

## 2024-02-01 NOTE — Telephone Encounter (Signed)
 FYI Only or Action Required?: Action required by provider: Refusing ED, pt requesting appt with Dr. Purcell, attempted to call [facility] to alert of situation, no answer.  Patient was last seen in primary care on 08/15/2023 by Purcell Emil Schanz, MD.  Called Nurse Triage reporting Chest Pain, Chest Injury, and pain with drinking and deep breath.  Symptoms began several days ago.  Interventions attempted: Rest, hydration, or home remedies and Other: UC on 8/21 with x-rays not yet resulted, pain worsening since UC.  Symptoms are: rapidly worsening.  Triage Disposition: Go to ED Now (Notify PCP)  Patient/caregiver understands and will follow disposition?: No, refuses disposition      Copied from CRM [id number]. Topic: Clinical - Red Word Triage >> Feb 01, 2024 10:09 AM Berneda FALCON wrote: Red Word that prompted transfer to Nurse Triage: Patient states he was hit in the chest during soccer 8/17, began having heart pain, went to urgent care 8/21 and did Xray but pain is getting worse and xray results are not back yet. Reason for Disposition  SEVERE chest pain  Answer Assessment - Initial Assessment Questions 1. MECHANISM: How did the injury happen?     hit hard in chest by ball during soccer 2. ONSET: When did the injury happen? (.e.g., minutes, hours, days ago)     8/17  6. SEVERITY: Any difficulty with breathing?     Denies but more pain with deep breath 8. PAIN: Is there pain? If Yes, ask: How bad is the pain? 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 Purcell. Advised call 911 if any worsening or new symptoms of SOB. Attempted to call [facility] to alert of situation, no answer.  Protocols used: Chest Injury-A-AH

## 2024-02-01 NOTE — Telephone Encounter (Signed)
 ATC pt but VM was full. PCP is not in office today and there are no other openings in the clinic for pt to be seen. Needs to see ED as recommended.

## 2024-02-01 NOTE — Telephone Encounter (Signed)
 Patient called back about trying to be seen in the office. I advised that the office had attempted to contact him and that their recommendation is to go to the ED for evaluation. Patient verbalized understanding and ended the call.

## 2024-02-05 ENCOUNTER — Encounter: Payer: Self-pay | Admitting: Emergency Medicine

## 2024-02-05 ENCOUNTER — Ambulatory Visit: Payer: Self-pay | Admitting: Emergency Medicine

## 2024-02-05 ENCOUNTER — Ambulatory Visit (INDEPENDENT_AMBULATORY_CARE_PROVIDER_SITE_OTHER)

## 2024-02-05 ENCOUNTER — Ambulatory Visit (INDEPENDENT_AMBULATORY_CARE_PROVIDER_SITE_OTHER): Admitting: Emergency Medicine

## 2024-02-05 VITALS — BP 126/76 | HR 68 | Temp 98.2°F | Ht 66.0 in | Wt 182.0 lb

## 2024-02-05 DIAGNOSIS — R102 Pelvic and perineal pain: Secondary | ICD-10-CM | POA: Diagnosis not present

## 2024-02-05 DIAGNOSIS — I10 Essential (primary) hypertension: Secondary | ICD-10-CM | POA: Diagnosis not present

## 2024-02-05 DIAGNOSIS — S20212A Contusion of left front wall of thorax, initial encounter: Secondary | ICD-10-CM | POA: Diagnosis not present

## 2024-02-05 DIAGNOSIS — R0789 Other chest pain: Secondary | ICD-10-CM

## 2024-02-05 DIAGNOSIS — N1831 Chronic kidney disease, stage 3a: Secondary | ICD-10-CM

## 2024-02-05 DIAGNOSIS — E785 Hyperlipidemia, unspecified: Secondary | ICD-10-CM

## 2024-02-05 LAB — URINALYSIS
Bilirubin Urine: NEGATIVE
Hgb urine dipstick: NEGATIVE
Ketones, ur: NEGATIVE
Leukocytes,Ua: NEGATIVE
Nitrite: NEGATIVE
Specific Gravity, Urine: 1.015 (ref 1.000–1.030)
Total Protein, Urine: NEGATIVE
Urine Glucose: NEGATIVE
Urobilinogen, UA: 0.2 (ref 0.0–1.0)
pH: 6 (ref 5.0–8.0)

## 2024-02-05 LAB — CBC WITH DIFFERENTIAL/PLATELET
Basophils Absolute: 0.1 K/uL (ref 0.0–0.1)
Basophils Relative: 0.9 % (ref 0.0–3.0)
Eosinophils Absolute: 0.1 K/uL (ref 0.0–0.7)
Eosinophils Relative: 0.8 % (ref 0.0–5.0)
HCT: 42.7 % (ref 39.0–52.0)
Hemoglobin: 13.8 g/dL (ref 13.0–17.0)
Lymphocytes Relative: 33.6 % (ref 12.0–46.0)
Lymphs Abs: 2.1 K/uL (ref 0.7–4.0)
MCHC: 32.3 g/dL (ref 30.0–36.0)
MCV: 77.7 fl — ABNORMAL LOW (ref 78.0–100.0)
Monocytes Absolute: 0.3 K/uL (ref 0.1–1.0)
Monocytes Relative: 5.1 % (ref 3.0–12.0)
Neutro Abs: 3.8 K/uL (ref 1.4–7.7)
Neutrophils Relative %: 59.6 % (ref 43.0–77.0)
Platelets: 260 K/uL (ref 150.0–400.0)
RBC: 5.5 Mil/uL (ref 4.22–5.81)
RDW: 13.4 % (ref 11.5–15.5)
WBC: 6.3 K/uL (ref 4.0–10.5)

## 2024-02-05 LAB — COMPREHENSIVE METABOLIC PANEL WITH GFR
ALT: 34 U/L (ref 0–53)
AST: 23 U/L (ref 0–37)
Albumin: 4.7 g/dL (ref 3.5–5.2)
Alkaline Phosphatase: 73 U/L (ref 39–117)
BUN: 15 mg/dL (ref 6–23)
CO2: 30 meq/L (ref 19–32)
Calcium: 10.1 mg/dL (ref 8.4–10.5)
Chloride: 100 meq/L (ref 96–112)
Creatinine, Ser: 1.09 mg/dL (ref 0.40–1.50)
GFR: 81.14 mL/min (ref >60.00)
Glucose, Bld: 90 mg/dL (ref 70–99)
Potassium: 3.7 meq/L (ref 3.5–5.1)
Sodium: 140 meq/L (ref 135–145)
Total Bilirubin: 0.5 mg/dL (ref 0.2–1.2)
Total Protein: 8 g/dL (ref 6.0–8.3)

## 2024-02-05 LAB — LIPID PANEL
Cholesterol: 202 mg/dL — ABNORMAL HIGH (ref 0–200)
HDL: 70.9 mg/dL (ref >39.00)
LDL Cholesterol: 115 mg/dL — ABNORMAL HIGH (ref 0–99)
NonHDL: 130.78
Total CHOL/HDL Ratio: 3
Triglycerides: 80 mg/dL (ref 0.0–149.0)
VLDL: 16 mg/dL (ref 0.0–40.0)

## 2024-02-05 LAB — HEMOGLOBIN A1C: Hgb A1c MFr Bld: 6.2 % (ref 4.6–6.5)

## 2024-02-05 MED ORDER — TRAMADOL HCL 50 MG PO TABS: 50.0000 mg | ORAL_TABLET | Freq: Three times a day (TID) | ORAL | 1 refills | Status: AC | PRN

## 2024-02-05 MED ORDER — VALSARTAN-HYDROCHLOROTHIAZIDE 160-12.5 MG PO TABS: 1.0000 | ORAL_TABLET | Freq: Every day | ORAL | 3 refills | Status: AC

## 2024-02-05 NOTE — Assessment & Plan Note (Signed)
Diet and nutrition discussed.  Lipid profile done today.  

## 2024-02-05 NOTE — Assessment & Plan Note (Signed)
 Benign physical examination Denies urinary symptoms Recommend blood work and urinalysis today Clinically stable.  No red flag signs or symptoms. No concerns.

## 2024-02-05 NOTE — Patient Instructions (Signed)
 Dolor en la pared torcica Chest Wall Pain El dolor en la pared torcica se produce en los huesos y los msculos del pecho o alrededor de Scientist, product/process development. Las causas del dolor en la pared torcica pueden ser las siguientes: Una lesin. Mucha tos. Usar [employer] del pecho y de [place]. En ocasiones, la causa puede ser desconocida. Este dolor puede tardar varias semanas o ms en mejorar. Siga estas instrucciones en su casa: Control del dolor, la rigidez y la hinchazn Si se lo indican, aplique hielo sobre la zona dolorida: Nature conservation officer hielo en una bolsa plstica. Coloque una toalla entre la piel y Copy. Aplique el hielo durante 20 minutos, 2 o 3 veces por da.  Actividad Haga reposo como se lo haya indicado el mdico. Evite hacer cosas que le causen dolor. Esto incluye levantar objetos pesados. Pregntele al mdico qu actividades son seguras para usted. Instrucciones generales  Use los medicamentos de venta libre y los recetados solamente como se lo haya indicado el mdico. No consuma ningn producto que contenga nicotina o tabaco, como cigarrillos, cigarrillos electrnicos y tabaco de Theatre manager. Si necesita ayuda para dejar de fumar, consulte al mdico. Concurra a todas las visitas de seguimiento como se lo haya indicado el mdico. Esto es importante. Comunquese con un mdico si: Tiene fiebre. El dolor en el pecho [place]. Aparecen nuevos sntomas. Solicite ayuda de inmediato si: Tiene Programme researcher, broadcasting/film/video (nuseas) o vomita. Berenice Primas o tiene sensacin de desvanecimiento. Tiene tos con mucosidad de los pulmones (esputo) o expectora sangre al toser. Le falta el aire. Estos sntomas pueden Customer service manager. No espere a ver si los sntomas desaparecen. Solicite atencin mdica de inmediato. Comunquese con el servicio de emergencias de su localidad (911 en los [place]). No conduzca por sus propios medios [employer]. Resumen El dolor en la pared torcica se  produce en los huesos y los msculos del pecho o alrededor de Scientist, product/process development. Puede tratarse con hielo, reposo y medicamentos. La afeccin tambin puede mejorar si evita hacer cosas que le causan dolor. Comunquese con un mdico si tiene fiebre, dolor en el pecho que empeora o sntomas nuevos. Pida ayuda de inmediato si siente que se va a desvanecer o le falta el aire. Estos sntomas pueden Customer service manager. Esta informacin no tiene Theme park manager el consejo del mdico. Asegrese de hacerle al mdico cualquier pregunta que tenga. Document Revised: 08/31/2020 Document Reviewed: 05/22/2022 Elsevier Patient Education  2024 [employer].

## 2024-02-05 NOTE — Assessment & Plan Note (Signed)
 Last GFR was normal We will repeat today along with urinalysis Advised to stay well-hydrated and avoid NSAIDs

## 2024-02-05 NOTE — Progress Notes (Signed)
 Kyle Hancock 47 y.o.   Chief Complaint  Patient presents with   Chest Pain    Patient is here for chest pain. Patient states 9 days ago he was playing soccer and someone elbowed him in the chest. Patient states the pain is still there, he says tylenol helps a little. He says he feels the pain when he makes sudden movement or heavy breathing . He had [facility]      HISTORY OF PRESENT ILLNESS: This is a 48 y.o. male complaining of pain to left upper chest area that started 9 days ago when he was elbowed in that location while playing soccer. Denies difficulty breathing or hemoptysis.  Went to urgent care center couple days ago and had x-rays done.  Told everything was okay. Tylenol helps with the pain.  No other associated symptoms. Also complaining of occasional suprapubic pain.  Denies fever or chills.  Denies nausea or vomiting.  Denies urinary symptoms.  Denies bowel movement problems.  No blood in the stools or blood in the urine. No other complaints or medical concerns today.  Chest Pain  Pertinent negatives include no abdominal pain, cough, dizziness, fever, headaches, nausea, shortness of breath or vomiting.     Prior to Admission medications   Medication Sig Start Date End Date Taking? Authorizing Provider  levocetirizine (XYZAL ) 5 MG tablet Take 1 tablet (5 mg total) by mouth every evening. 09/17/23  Yes Erastus Bartolomei, Emil Schanz, MD  valsartan -hydrochlorothiazide  (DIOVAN -HCT) 160-12.5 MG tablet Take 1 tablet by mouth daily. 01/18/23  Yes Kyndel Egger Jose, MD  Cyanocobalamin (VITAMIN B 12 PO) Take by mouth daily. Patient not taking: Reported on 02/05/2024    [provider]  hydrocortisone  (ANUSOL -HC) 2.5 % rectal cream Place 1 application rectally 2 (two) times daily. Patient not taking: Reported on 02/05/2024 05/24/20   Purcell Emil Schanz, MD  Olopatadine  HCl (PATADAY ) 0.2 % SOLN Apply 2 drops to eye 2 (two) times daily. Patient not taking:  Reported on 02/05/2024 09/17/23   Purcell Emil Schanz, MD    No Known Allergies  Patient Active Problem List   Diagnosis Date Noted   History of kidney disease 01/18/2023   Stage 3a chronic kidney disease (HCC) 11/01/2022   Dyslipidemia 08/26/2019   Essential hypertension 06/19/2018    No past medical history on file.  No past surgical history on file.  Social History   Socioeconomic History   Marital status: Married    Spouse name: Not on file   Number of children: Not on file   Years of education: Not on file   Highest education level: Not on file  Occupational History   Not on file  Tobacco Use   Smoking status: Never   Smokeless tobacco: Never  Substance and Sexual Activity   Alcohol use: Not on file   Drug use: Not on file   Sexual activity: Not on file  Other Topics Concern   Not on file  Social History Narrative   Not on file   Social Drivers of Health   Financial Resource Strain: Not on file  Food Insecurity: Not on file  Transportation Needs: Not on file  Physical Activity: Not on file  Stress: Not on file  Social Connections: Not on file  Intimate Partner Violence: Not on file    No family history on file.   Review of Systems  Constitutional: Negative.  Negative for chills and fever.  HENT: Negative.  Negative for congestion and sore throat.  Respiratory: Negative.  Negative for cough and shortness of breath.   Cardiovascular:  Positive for chest pain.  Gastrointestinal:  Negative for abdominal pain, diarrhea, nausea and vomiting.  Genitourinary: Negative.  Negative for dysuria and hematuria.  Skin: Negative.  Negative for rash.  Neurological: Negative.  Negative for dizziness and headaches.  All other systems reviewed and are negative.   Today's Vitals   02/05/24 1301  BP: 126/76  Pulse: 68  Temp: 98.2 F (36.8 C)  TempSrc: Oral  SpO2: 98%  Weight: 182 lb (82.6 kg)  Height: 5' 6 (1.676 m)   Body mass index is 29.38  kg/m.   Physical Exam Constitutional:      Appearance: Normal appearance.  HENT:     Head: Normocephalic.     Mouth/Throat:     Mouth: Mucous membranes are moist.     Pharynx: Oropharynx is clear.  Eyes:     Extraocular Movements: Extraocular movements intact.     Pupils: Pupils are equal, round, and reactive to light.  Cardiovascular:     Rate and Rhythm: Normal rate and regular rhythm.     Pulses: Normal pulses.     Heart sounds: Normal heart sounds.  Pulmonary:     Effort: Pulmonary effort is normal.     Breath sounds: Normal breath sounds.  Chest:     Chest wall: Tenderness (Tender medial and mid clavicular areas) present.  Abdominal:     Palpations: Abdomen is soft.     Tenderness: There is no abdominal tenderness.  Musculoskeletal:     Cervical back: No tenderness.  Lymphadenopathy:     Cervical: No cervical adenopathy.  Skin:    General: Skin is warm and dry.     Capillary Refill: Capillary refill takes less than 2 seconds.  Neurological:     General: No focal deficit present.     Mental Status: He is alert and oriented to person, place, and time.  Psychiatric:        Mood and Affect: Mood normal.        Behavior: Behavior normal.    DG Chest 2 View Result Date: 02/05/2024 CLINICAL DATA:  Chest wall/clavicle trauma with pain EXAM: DG CHEST 2V COMPARISON:  None available. FINDINGS: Cardiomediastinal silhouette and pulmonary vasculature are within normal limits. Lungs are clear. IMPRESSION: No acute cardiopulmonary process. Electronically Signed   By: Aliene Lloyd M.D.   On: 02/05/2024 14:31     ASSESSMENT & PLAN: Problem List Items Addressed This Visit       Cardiovascular and Mediastinum   Essential hypertension   BP Readings from Last 3 Encounters:  02/05/24 126/76  08/15/23 118/82  01/18/23 130/78  Well-controlled hypertension Continue valsartan  HCT 160-12.5 mg daily       Relevant Medications   valsartan -hydrochlorothiazide  (DIOVAN -HCT)  160-12.5 MG tablet   Other Relevant Orders   Urinalysis     Genitourinary   Stage 3a chronic kidney disease (HCC)   Last GFR was normal We will repeat today along with urinalysis Advised to stay well-hydrated and avoid NSAIDs        Other   Dyslipidemia   Diet and nutrition discussed Lipid profile done today      Relevant Orders   Comprehensive metabolic panel with GFR   Lipid panel   Suprapubic pain   Benign physical examination Denies urinary symptoms Recommend blood work and urinalysis today Clinically stable.  No red flag signs or symptoms. No concerns.      Relevant Orders  Urinalysis   CBC with Differential/Platelet   Contusion of left chest wall - Primary   Blunt trauma to chest. No clavicular or rib fracture seen on x-ray Benign physical examination.  No hematoma Tender to palpation Pain management discussed      Relevant Orders   DG Chest 2 View (Completed)   Urinalysis   Comprehensive metabolic panel with GFR   CBC with Differential/Platelet   Hemoglobin A1c   Lipid panel   Chest wall pain   Secondary to chest wall contusion Normal chest x-ray Pain management discussed Recommend to use Tylenol for mild to moderate pain and tramadol  for moderate to severe pain      Relevant Medications   traMADol  (ULTRAM ) 50 MG tablet   Patient Instructions  Dolor en la pared torcica Chest Wall Pain El dolor en la pared torcica se produce en los huesos y los msculos del pecho o alrededor de Scientist, product/process development. Las causas del dolor en la pared torcica pueden ser las siguientes: Una lesin. Mucha tos. Usar [employer] del pecho y de [place]. En ocasiones, la causa puede ser desconocida. Este dolor puede tardar varias semanas o ms en mejorar. Siga estas instrucciones en su casa: Control del dolor, la rigidez y la hinchazn Si se lo indican, aplique hielo sobre la zona dolorida: Nature conservation officer hielo en una bolsa plstica. Coloque una toalla entre la piel y la  bolsa. Aplique el hielo durante 20 minutos, 2 o 3 veces por da.  Actividad Haga reposo como se lo haya indicado el mdico. Evite hacer cosas que le causen dolor. Esto incluye levantar objetos pesados. Pregntele al mdico qu actividades son seguras para usted. Instrucciones generales  Use los medicamentos de venta libre y los recetados solamente como se lo haya indicado el mdico. No consuma ningn producto que contenga nicotina o tabaco, como cigarrillos, cigarrillos electrnicos y tabaco de mascar. Si necesita ayuda para dejar de fumar, consulte al mdico. Concurra a todas las visitas de seguimiento como se lo haya indicado el mdico. Esto es importante. Comunquese con un mdico si: Tiene fiebre. El dolor en el pecho [place]. Aparecen nuevos sntomas. Solicite ayuda de inmediato si: Tiene malestar estomacal (nuseas) o vomita. Genna o tiene sensacin de desvanecimiento. Tiene tos con mucosidad de los pulmones (esputo) o expectora sangre al toser. Le falta el aire. Estos sntomas pueden Customer service manager. No espere a ver si los sntomas desaparecen. Solicite atencin mdica de inmediato. Comunquese con el servicio de emergencias de su localidad (911 en los [place]). No conduzca por sus propios medios [employer]. Resumen El dolor en la pared torcica se produce en los huesos y los msculos del pecho o alrededor de Scientist, product/process development. Puede tratarse con hielo, reposo y medicamentos. La afeccin tambin puede mejorar si evita hacer cosas que le causan dolor. Comunquese con un mdico si tiene fiebre, dolor en el pecho que empeora o sntomas nuevos. Pida ayuda de inmediato si siente que se va a desvanecer o le falta el aire. Estos sntomas pueden Customer service manager. Esta informacin no tiene Theme park manager el consejo del mdico. Asegrese de hacerle al mdico cualquier pregunta que tenga. Document Revised: 08/31/2020 Document Reviewed: 05/22/2022 Elsevier Patient  Education  2024 Elsevier Inc.    Emil Schaumann, MD [facility]

## 2024-02-05 NOTE — Assessment & Plan Note (Signed)
 BP Readings from Last 3 Encounters:  02/05/24 126/76  08/15/23 118/82  01/18/23 130/78  Well-controlled hypertension Continue valsartan  HCT 160-12.5 mg daily

## 2024-02-05 NOTE — Assessment & Plan Note (Signed)
 Secondary to chest wall contusion Normal chest x-ray Pain management discussed Recommend to use Tylenol for mild to moderate pain and tramadol  for moderate to severe pain

## 2024-02-05 NOTE — Assessment & Plan Note (Signed)
 Blunt trauma to chest. No clavicular or rib fracture seen on x-ray Benign physical examination.  No hematoma Tender to palpation Pain management discussed
